# Patient Record
Sex: Male | Born: 1955 | Race: White | Hispanic: No | Marital: Married | Smoking: Never smoker
Health system: Southern US, Community
[De-identification: ages and names within clinical notes are randomized; demographics above are authoritative.]

## PROBLEM LIST (undated history)

## (undated) DIAGNOSIS — E7800 Pure hypercholesterolemia, unspecified: Secondary | ICD-10-CM

## (undated) DIAGNOSIS — Z87442 Personal history of urinary calculi: Secondary | ICD-10-CM

## (undated) DIAGNOSIS — G4730 Sleep apnea, unspecified: Secondary | ICD-10-CM

## (undated) DIAGNOSIS — M5430 Sciatica, unspecified side: Secondary | ICD-10-CM

## (undated) DIAGNOSIS — I10 Essential (primary) hypertension: Secondary | ICD-10-CM

## (undated) HISTORY — PX: TONSILLECTOMY: SUR1361

---

## 2005-02-14 ENCOUNTER — Ambulatory Visit (HOSPITAL_BASED_OUTPATIENT_CLINIC_OR_DEPARTMENT_OTHER): Admission: RE | Admit: 2005-02-14 | Discharge: 2005-02-14 | Payer: Self-pay | Admitting: Unknown Physician Specialty

## 2005-02-21 ENCOUNTER — Ambulatory Visit: Payer: Self-pay | Admitting: Internal Medicine

## 2006-08-05 ENCOUNTER — Encounter: Admission: RE | Admit: 2006-08-05 | Discharge: 2006-08-05 | Payer: Self-pay | Admitting: *Deleted

## 2006-09-21 ENCOUNTER — Encounter: Admission: RE | Admit: 2006-09-21 | Discharge: 2006-09-21 | Payer: Self-pay | Admitting: *Deleted

## 2006-10-14 ENCOUNTER — Encounter: Admission: RE | Admit: 2006-10-14 | Discharge: 2006-10-14 | Payer: Self-pay | Admitting: *Deleted

## 2009-07-01 ENCOUNTER — Encounter: Admission: RE | Admit: 2009-07-01 | Discharge: 2009-07-01 | Payer: Self-pay | Admitting: Neurosurgery

## 2009-07-15 ENCOUNTER — Encounter: Admission: RE | Admit: 2009-07-15 | Discharge: 2009-07-15 | Payer: Self-pay | Admitting: Neurosurgery

## 2010-07-26 ENCOUNTER — Encounter: Payer: Self-pay | Admitting: *Deleted

## 2010-11-20 NOTE — Procedures (Signed)
NAME:  Jeffrey Wilkerson, Jeffrey Wilkerson             ACCOUNT NO.:  [id number]   MEDICAL RECORD NO.:  [id number]          PATIENT TYPE:  OUT   LOCATION:  SLEEP CENTER                 FACILITY:  [facility]   PHYSICIAN:  Clinton D. Maple Hudson, M.D. DATE OF BIRTH:  1956-04-25   DATE OF STUDY:  02/14/2005                              NOCTURNAL POLYSOMNOGRAM   REFERRING PHYSICIAN:  Dr. Theadora Rama   DATE OF STUDY:  February 14, 2005   INDICATION FOR STUDY:  Hypersomnia with sleep apnea. Epworth Sleepiness  Score 13/24, BMI 29, weight 205 pounds.   SLEEP ARCHITECTURE:  Total sleep time 362 minutes with sleep efficiency 86%.  Stage I was 5%, stage II 65%, stages III and IV 28%, REM 1% of total sleep  time. Sleep latency 12 minutes, REM latency 254 minutes, awake after sleep  onset 47 minutes, arousal index increased at 40. No bedtime medication  taken.   RESPIRATORY DATA:  Split study protocol. Apnea/hypopnea index (RDI, AHI)  24.5 obstructive events per hour indicating moderate obstructive sleep  apnea/hypopnea syndrome. This included 17 central apneas, 22 obstructive  apneas, 1 mixed apnea and 108 hypopneas. Almost all events were while  supine. REM RDI 12. CPAP was titrated to 8 CWP, AHI 0, using a ResMed Swift  with small nasal pillows and heated humidifier.   OXYGEN DATA:  Moderate snoring with oxygen desaturation to a nadir of 86%.  After CPAP control saturation held 95-97% on room air.   CARDIAC DATA:  Sinus rhythm with sinus bradycardia.   MOVEMENT/PARASOMNIA:  A total of 152 limb jerks were recorded of which 29  were associated with arousal or awakening for a periodic limb movement with  arousal index of 4.8 per hour which is increased.   IMPRESSION/RECOMMENDATION:  1.  Moderate obstructive sleep apnea/hypopnea syndrome, apnea/hypopnea index      24.5 per hour with moderate snoring and oxygen desaturation to 86%.  2.  Successful continuous positive airway pressure titration to 8 CWP,      Respiratory  Disturbance Index 0 per hour,      using a ResMed Swift with small nasal pillows and heated humidifier.  3.  Periodic limb movement with arousal, 4.8 per hour.      Clinton D. Maple Hudson, M.D.  Diplomate, Biomedical engineer of Sleep Medicine  Electronically Signed     CDY/MEDQ  D:  02/21/2005 13:36:06  T:  02/21/2005 22:19:52  Job:  [id number]

## 2015-07-25 ENCOUNTER — Encounter (HOSPITAL_BASED_OUTPATIENT_CLINIC_OR_DEPARTMENT_OTHER): Payer: Self-pay

## 2015-07-25 ENCOUNTER — Emergency Department (HOSPITAL_BASED_OUTPATIENT_CLINIC_OR_DEPARTMENT_OTHER)
Admission: EM | Admit: 2015-07-25 | Discharge: 2015-07-25 | Discharge status: Against Medical Advice | Payer: BLUE CROSS/BLUE SHIELD | Attending: Emergency Medicine | Admitting: Emergency Medicine

## 2015-07-25 DIAGNOSIS — R197 Diarrhea, unspecified: Secondary | ICD-10-CM | POA: Diagnosis not present

## 2015-07-25 DIAGNOSIS — I10 Essential (primary) hypertension: Secondary | ICD-10-CM | POA: Insufficient documentation

## 2015-07-25 DIAGNOSIS — R1032 Left lower quadrant pain: Secondary | ICD-10-CM | POA: Insufficient documentation

## 2015-07-25 HISTORY — DX: Essential (primary) hypertension: I10

## 2015-07-25 HISTORY — DX: Pure hypercholesterolemia, unspecified: E78.00

## 2015-07-25 NOTE — ED Notes (Signed)
Pt explained risk of leaving against medical advice, pt aware and signed ama form on his on will

## 2015-07-25 NOTE — ED Notes (Signed)
LLQ pain-started this am-loose stool x 1-denies n/v-NAD-steady gait

## 2017-02-08 DIAGNOSIS — G8929 Other chronic pain: Secondary | ICD-10-CM | POA: Insufficient documentation

## 2017-09-15 ENCOUNTER — Other Ambulatory Visit: Payer: Self-pay | Admitting: Family Medicine

## 2017-09-15 DIAGNOSIS — Z789 Other specified health status: Secondary | ICD-10-CM

## 2017-09-15 DIAGNOSIS — R079 Chest pain, unspecified: Secondary | ICD-10-CM

## 2017-09-16 ENCOUNTER — Encounter (HOSPITAL_COMMUNITY): Payer: Self-pay

## 2017-09-16 ENCOUNTER — Ambulatory Visit (HOSPITAL_COMMUNITY)
Admission: RE | Admit: 2017-09-16 | Discharge: 2017-09-16 | Disposition: A | Discharge status: Home / Self Care | Payer: BLUE CROSS/BLUE SHIELD | Source: Ambulatory Visit | Attending: Family Medicine | Admitting: Family Medicine

## 2017-09-16 DIAGNOSIS — R079 Chest pain, unspecified: Secondary | ICD-10-CM | POA: Diagnosis present

## 2017-09-16 DIAGNOSIS — Z789 Other specified health status: Secondary | ICD-10-CM

## 2017-09-16 MED ORDER — IOPAMIDOL (ISOVUE-370) INJECTION 76%
INTRAVENOUS | Status: AC
  Filled 2017-09-16: qty 100

## 2017-09-16 MED ORDER — IOPAMIDOL (ISOVUE-370) INJECTION 76%
100.0000 mL | Freq: Once | INTRAVENOUS | Status: AC | PRN
  Administered 2017-09-16: 100 mL via INTRAVENOUS

## 2019-08-09 ENCOUNTER — Other Ambulatory Visit: Payer: Self-pay | Admitting: Urology

## 2019-08-14 ENCOUNTER — Encounter (HOSPITAL_BASED_OUTPATIENT_CLINIC_OR_DEPARTMENT_OTHER): Payer: Self-pay | Admitting: Urology

## 2019-08-14 ENCOUNTER — Other Ambulatory Visit: Payer: Self-pay

## 2019-08-14 NOTE — Progress Notes (Addendum)
Spoke w/ via phone for pre-op interview---[facility] needs dos----     I stat 8         Lab results------patient to have [facility] dr Shelly Flatten to fax ekg done 3-4 months ago to [phone], no ekg received from [facility], ekg order entered in epic COVID test ------08-18-2019 Arrive at -------630 am 2-17--2021 NPO after ------midnight Medications to take morning of surgery -----patient wishes to take atorvastatin after surgery Diabetic medication -----n/a Patient Special Instructions -----bring cpap mask and tubing Pre-Op special Istructions ----- Patient verbalized understanding of instructions that were given at this phone interview. Patient denies shortness of breath, chest pain, fever, cough a this phone interview.

## 2019-08-18 ENCOUNTER — Other Ambulatory Visit (HOSPITAL_COMMUNITY)
Admission: RE | Admit: 2019-08-18 | Discharge: 2019-08-18 | Disposition: A | Discharge status: Home / Self Care | Payer: BC Managed Care – PPO | Source: Ambulatory Visit | Attending: Urology | Admitting: Urology

## 2019-08-18 DIAGNOSIS — Z20822 Contact with and (suspected) exposure to covid-19: Secondary | ICD-10-CM | POA: Diagnosis not present

## 2019-08-18 DIAGNOSIS — Z01812 Encounter for preprocedural laboratory examination: Secondary | ICD-10-CM | POA: Diagnosis present

## 2019-08-18 LAB — SARS CORONAVIRUS 2 (TAT 6-24 HRS): SARS Coronavirus 2: NEGATIVE

## 2019-08-21 NOTE — Anesthesia Preprocedure Evaluation (Signed)
Anesthesia Evaluation  Patient identified by MRN, date of birth, ID band Patient awake    Reviewed: Allergy & Precautions, NPO status , Patient's Chart, lab work & pertinent test results  Airway Mallampati: II  TM Distance: >3 FB Neck ROM: Full    Dental no notable dental hx.    Pulmonary sleep apnea ,    Pulmonary exam normal breath sounds clear to auscultation       Cardiovascular hypertension, Pt. on medications Normal cardiovascular exam Rhythm:Regular Rate:Normal  HLD   Neuro/Psych negative neurological ROS  negative psych ROS   GI/Hepatic negative GI ROS, Neg liver ROS,   Endo/Other  negative endocrine ROS  Renal/GU Right ureteral stone  negative genitourinary   Musculoskeletal negative musculoskeletal ROS (+)   Abdominal   Peds negative pediatric ROS (+)  Hematology negative hematology ROS (+)   Anesthesia Other Findings   Reproductive/Obstetrics negative OB ROS                             Anesthesia Physical Anesthesia Plan  ASA: II  Anesthesia Plan: General   Post-op Pain Management:    Induction: Intravenous  PONV Risk Score and Plan: 3 and Ondansetron, Dexamethasone, Midazolam and Treatment may vary due to age or medical condition  Airway Management Planned: LMA  Additional Equipment: None  Intra-op Plan:   Post-operative Plan: Extubation in OR  Informed Consent: I have reviewed the patients History and Physical, chart, labs and discussed the procedure including the risks, benefits and alternatives for the proposed anesthesia with the patient or authorized representative who has indicated his/her understanding and acceptance.     Dental advisory given  Plan Discussed with: CRNA  Anesthesia Plan Comments:         Anesthesia Quick Evaluation

## 2019-08-22 ENCOUNTER — Encounter (HOSPITAL_BASED_OUTPATIENT_CLINIC_OR_DEPARTMENT_OTHER): Payer: Self-pay | Admitting: Urology

## 2019-08-22 ENCOUNTER — Encounter (HOSPITAL_BASED_OUTPATIENT_CLINIC_OR_DEPARTMENT_OTHER)
Admission: RE | Disposition: A | Discharge status: Home / Self Care | Payer: Self-pay | Source: Home / Self Care | Attending: Urology

## 2019-08-22 ENCOUNTER — Ambulatory Visit (HOSPITAL_BASED_OUTPATIENT_CLINIC_OR_DEPARTMENT_OTHER): Payer: BC Managed Care – PPO | Admitting: Anesthesiology

## 2019-08-22 ENCOUNTER — Ambulatory Visit (HOSPITAL_BASED_OUTPATIENT_CLINIC_OR_DEPARTMENT_OTHER)
Admission: RE | Admit: 2019-08-22 | Discharge: 2019-08-22 | Disposition: A | Discharge status: Home / Self Care | Payer: BC Managed Care – PPO | Attending: Urology | Admitting: Urology

## 2019-08-22 DIAGNOSIS — I10 Essential (primary) hypertension: Secondary | ICD-10-CM | POA: Insufficient documentation

## 2019-08-22 DIAGNOSIS — Z7982 Long term (current) use of aspirin: Secondary | ICD-10-CM | POA: Diagnosis not present

## 2019-08-22 DIAGNOSIS — N132 Hydronephrosis with renal and ureteral calculous obstruction: Secondary | ICD-10-CM | POA: Insufficient documentation

## 2019-08-22 DIAGNOSIS — G4730 Sleep apnea, unspecified: Secondary | ICD-10-CM | POA: Diagnosis not present

## 2019-08-22 DIAGNOSIS — Z79899 Other long term (current) drug therapy: Secondary | ICD-10-CM | POA: Diagnosis not present

## 2019-08-22 DIAGNOSIS — R109 Unspecified abdominal pain: Secondary | ICD-10-CM | POA: Diagnosis present

## 2019-08-22 HISTORY — DX: Sleep apnea, unspecified: G47.30

## 2019-08-22 HISTORY — DX: Personal history of urinary calculi: Z87.442

## 2019-08-22 HISTORY — PX: CYSTOSCOPY/URETEROSCOPY/HOLMIUM LASER/STENT PLACEMENT: SHX6546

## 2019-08-22 HISTORY — DX: Sciatica, unspecified side: M54.30

## 2019-08-22 LAB — POCT I-STAT, CHEM 8
BUN: 21 mg/dL (ref 8–23)
Calcium, Ion: 1.29 mmol/L (ref 1.15–1.40)
Chloride: 104 mmol/L (ref 98–111)
Creatinine, Ser: 1 mg/dL (ref 0.61–1.24)
Glucose, Bld: 99 mg/dL (ref 70–99)
HCT: 43 % (ref 39.0–52.0)
Hemoglobin: 14.6 g/dL (ref 13.0–17.0)
Potassium: 4.1 mmol/L (ref 3.5–5.1)
Sodium: 141 mmol/L (ref 135–145)
TCO2: 25 mmol/L (ref 22–32)

## 2019-08-22 SURGERY — CYSTOSCOPY/URETEROSCOPY/HOLMIUM LASER/STENT PLACEMENT
Anesthesia: General | Site: Ureter | Laterality: Right

## 2019-08-22 MED ORDER — ONDANSETRON HCL 4 MG/2ML IJ SOLN
INTRAMUSCULAR | Status: AC
  Filled 2019-08-22: qty 2

## 2019-08-22 MED ORDER — KETOROLAC TROMETHAMINE 30 MG/ML IJ SOLN
INTRAMUSCULAR | Status: DC | PRN
  Administered 2019-08-22: 30 mg via INTRAVENOUS

## 2019-08-22 MED ORDER — CEPHALEXIN 500 MG PO CAPS: 500.0000 mg | ORAL_CAPSULE | Freq: Two times a day (BID) | ORAL | 0 refills | Status: AC

## 2019-08-22 MED ORDER — FENTANYL CITRATE (PF) 100 MCG/2ML IJ SOLN
INTRAMUSCULAR | Status: AC
  Filled 2019-08-22: qty 2

## 2019-08-22 MED ORDER — PROPOFOL 10 MG/ML IV BOLUS
INTRAVENOUS | Status: AC
  Filled 2019-08-22: qty 40

## 2019-08-22 MED ORDER — PHENAZOPYRIDINE HCL 200 MG PO TABS: 200.0000 mg | ORAL_TABLET | Freq: Three times a day (TID) | ORAL | 0 refills | Status: AC | PRN

## 2019-08-22 MED ORDER — PROPOFOL 10 MG/ML IV BOLUS
INTRAVENOUS | Status: DC | PRN
  Administered 2019-08-22: 150 mg via INTRAVENOUS

## 2019-08-22 MED ORDER — HYDROMORPHONE HCL 1 MG/ML IJ SOLN
0.2500 mg | INTRAMUSCULAR | Status: DC | PRN
  Filled 2019-08-22: qty 0.5

## 2019-08-22 MED ORDER — SODIUM CHLORIDE 0.9 % IR SOLN
Status: DC | PRN
  Administered 2019-08-22: 3000 mL via INTRAVESICAL

## 2019-08-22 MED ORDER — DEXAMETHASONE SODIUM PHOSPHATE 10 MG/ML IJ SOLN
INTRAMUSCULAR | Status: AC
  Filled 2019-08-22: qty 1

## 2019-08-22 MED ORDER — ONDANSETRON HCL 4 MG/2ML IJ SOLN
INTRAMUSCULAR | Status: DC | PRN
  Administered 2019-08-22: 4 mg via INTRAVENOUS

## 2019-08-22 MED ORDER — ONDANSETRON HCL 4 MG PO TABS: 4.0000 mg | ORAL_TABLET | Freq: Every day | ORAL | 1 refills | Status: AC | PRN

## 2019-08-22 MED ORDER — HYDROCODONE-ACETAMINOPHEN 5-325 MG PO TABS: 1.0000 | ORAL_TABLET | ORAL | 0 refills | Status: AC | PRN

## 2019-08-22 MED ORDER — LIDOCAINE 2% (20 MG/ML) 5 ML SYRINGE
INTRAMUSCULAR | Status: AC
  Filled 2019-08-22: qty 5

## 2019-08-22 MED ORDER — LIDOCAINE 2% (20 MG/ML) 5 ML SYRINGE
INTRAMUSCULAR | Status: DC | PRN
  Administered 2019-08-22: 60 mg via INTRAVENOUS

## 2019-08-22 MED ORDER — DEXAMETHASONE SODIUM PHOSPHATE 10 MG/ML IJ SOLN
INTRAMUSCULAR | Status: DC | PRN
  Administered 2019-08-22: 10 mg via INTRAVENOUS

## 2019-08-22 MED ORDER — LACTATED RINGERS IV SOLN
INTRAVENOUS | Status: DC
  Filled 2019-08-22: qty 1000

## 2019-08-22 MED ORDER — MIDAZOLAM HCL 2 MG/2ML IJ SOLN
INTRAMUSCULAR | Status: AC
  Filled 2019-08-22: qty 2

## 2019-08-22 MED ORDER — ACETAMINOPHEN 500 MG PO TABS
ORAL_TABLET | ORAL | Status: AC
  Filled 2019-08-22: qty 2

## 2019-08-22 MED ORDER — CEFAZOLIN SODIUM-DEXTROSE 2-4 GM/100ML-% IV SOLN
INTRAVENOUS | Status: AC
  Filled 2019-08-22: qty 100

## 2019-08-22 MED ORDER — IOHEXOL 300 MG/ML  SOLN
INTRAMUSCULAR | Status: DC | PRN
  Administered 2019-08-22: 10 mL

## 2019-08-22 MED ORDER — EPHEDRINE 5 MG/ML INJ
INTRAVENOUS | Status: AC
  Filled 2019-08-22: qty 10

## 2019-08-22 MED ORDER — OXYCODONE HCL 5 MG PO TABS
5.0000 mg | ORAL_TABLET | Freq: Once | ORAL | Status: DC | PRN
  Filled 2019-08-22: qty 1

## 2019-08-22 MED ORDER — MIDAZOLAM HCL 5 MG/5ML IJ SOLN
INTRAMUSCULAR | Status: DC | PRN
  Administered 2019-08-22: 2 mg via INTRAVENOUS

## 2019-08-22 MED ORDER — MEPERIDINE HCL 25 MG/ML IJ SOLN
6.2500 mg | INTRAMUSCULAR | Status: DC | PRN
  Filled 2019-08-22: qty 1

## 2019-08-22 MED ORDER — CEFAZOLIN SODIUM-DEXTROSE 2-3 GM-%(50ML) IV SOLR
INTRAVENOUS | Status: DC | PRN
  Administered 2019-08-22: 2 g via INTRAVENOUS

## 2019-08-22 MED ORDER — PROMETHAZINE HCL 25 MG/ML IJ SOLN
6.2500 mg | INTRAMUSCULAR | Status: DC | PRN
  Filled 2019-08-22: qty 1

## 2019-08-22 MED ORDER — FENTANYL CITRATE (PF) 100 MCG/2ML IJ SOLN
INTRAMUSCULAR | Status: DC | PRN
  Administered 2019-08-22 (×2): 50 ug via INTRAVENOUS

## 2019-08-22 MED ORDER — ACETAMINOPHEN 500 MG PO TABS
1000.0000 mg | ORAL_TABLET | Freq: Once | ORAL | Status: AC
  Administered 2019-08-22: 1000 mg via ORAL
  Filled 2019-08-22: qty 2

## 2019-08-22 MED ORDER — OXYCODONE HCL 5 MG/5ML PO SOLN
5.0000 mg | Freq: Once | ORAL | Status: DC | PRN
  Filled 2019-08-22: qty 5

## 2019-08-22 MED ORDER — OXYBUTYNIN CHLORIDE 5 MG PO TABS: 5.0000 mg | ORAL_TABLET | Freq: Three times a day (TID) | ORAL | 1 refills | Status: AC | PRN

## 2019-08-22 MED ORDER — EPHEDRINE SULFATE 50 MG/ML IJ SOLN
INTRAMUSCULAR | Status: DC | PRN
  Administered 2019-08-22: 10 mg via INTRAVENOUS

## 2019-08-22 MED ORDER — KETOROLAC TROMETHAMINE 30 MG/ML IJ SOLN
INTRAMUSCULAR | Status: AC
  Filled 2019-08-22: qty 1

## 2019-08-22 SURGICAL SUPPLY — 28 items
APL SKNCLS STERI-STRIP NONHPOA (GAUZE/BANDAGES/DRESSINGS)
BAG DRAIN URO-CYSTO SKYTR STRL (DRAIN) ×3 IMPLANT
BAG DRN UROCATH (DRAIN) ×1
BASKET STONE 1.7 NGAGE (UROLOGICAL SUPPLIES) IMPLANT
BASKET ZERO TIP NITINOL 2.4FR (BASKET) ×3 IMPLANT
BENZOIN TINCTURE PRP APPL 2/3 (GAUZE/BANDAGES/DRESSINGS) IMPLANT
BSKT STON RTRVL ZERO TP 2.4FR (BASKET) ×1
CATH URET 5FR 28IN OPEN ENDED (CATHETERS) ×2 IMPLANT
CLOSURE WOUND 1/2 X4 (GAUZE/BANDAGES/DRESSINGS)
CLOTH BEACON ORANGE TIMEOUT ST (SAFETY) ×3 IMPLANT
FIBER LASER FLEXIVA 365 (UROLOGICAL SUPPLIES) IMPLANT
FIBER LASER TRAC TIP (UROLOGICAL SUPPLIES) ×2 IMPLANT
GLOVE BIO SURGEON STRL SZ7.5 (GLOVE) ×3 IMPLANT
GOWN STRL REUS W/TWL XL LVL3 (GOWN DISPOSABLE) ×5 IMPLANT
GUIDEWIRE STR DUAL SENSOR (WIRE) ×2 IMPLANT
GUIDEWIRE ZIPWRE .038 STRAIGHT (WIRE) ×3 IMPLANT
IV NS IRRIG 3000ML ARTHROMATIC (IV SOLUTION) ×3 IMPLANT
KIT TURNOVER CYSTO (KITS) ×3 IMPLANT
MANIFOLD NEPTUNE II (INSTRUMENTS) ×3 IMPLANT
NS IRRIG 500ML POUR BTL (IV SOLUTION) ×3 IMPLANT
PACK CYSTO (CUSTOM PROCEDURE TRAY) ×3 IMPLANT
SHEATH URETERAL 12FRX35CM (MISCELLANEOUS) ×2 IMPLANT
STENT URET 6FRX24 CONTOUR (STENTS) ×2 IMPLANT
STRIP CLOSURE SKIN 1/2X4 (GAUZE/BANDAGES/DRESSINGS) IMPLANT
SYR 10ML LL (SYRINGE) ×3 IMPLANT
TUBE CONNECTING 12'X1/4 (SUCTIONS) ×1
TUBE CONNECTING 12X1/4 (SUCTIONS) ×1 IMPLANT
TUBING UROLOGY SET (TUBING) ×3 IMPLANT

## 2019-08-22 NOTE — H&P (Signed)
PRE-OP H&P  Office Visit Report     08/09/2019   --------------------------------------------------------------------------------   Jeffrey Wilkerson  MRN: [id number]  DOB: 04-03-1956, 64 year old Male   PRIMARY CARE:  Loraine Leriche MacPherson  REFERRING:  Ozella Rocks, MD  PROVIDER:  Rhoderick Moody, M.D.  LOCATION:  [facility] [phone]     --------------------------------------------------------------------------------   CC/HPI: Right flank pain   Ms. Cauthon is a 64 year old male recently seen by Dr. Sherron Monday for microscopic hematuria. He was found to have multiple right proximal ureteral calculi with moderate hydronephrosis.   08/09/19: The patient is here today for a routine follow-up and KUB. He denies flank pain, dysuria, hematuria, nausea or fever/chills. UA today shows no signs of acute cystitis.     ALLERGIES: Td    MEDICATIONS: Percocet 5 mg-325 mg tablet 1-2 tablet PO Q 6 H PRN  Aspirin 81 MG TABS Oral  Fish Oil CAPS Oral  Flomax 0.4 mg capsule 1 capsule PO Daily  Lipitor 10 mg tablet Oral  Tab-A-Vite With Iron tablet Oral     GU PSH: Locm 300-399Mg /Ml Iodine,1Ml - 08/03/2019, 08/03/2019       PSH Notes: Tonsillectomy   NON-GU PSH: Remove Tonsils - 2007/11/19     GU PMH: Ureteral calculus - 08/03/2019 Microscopic hematuria - 07/18/2019 Urinary Frequency - 07/18/2019 Abdominal Pain Unspec, Abdominal pain - 2012/11/18 Incontinence w/o Sensation, Urinary incontinence without sensory awareness - 11/18/12      PMH Notes:  1898-07-05 00:00:00 - Note: Normal Routine History And Physical Adult   NON-GU PMH: Personal history of other diseases of the circulatory system, History of hypertension - 11-18-12 Personal history of other diseases of the nervous system and sense organs, History of sleep apnea - 2014 Personal history of other endocrine, nutritional and metabolic disease, History of hypercholesterolemia - 11/18/12    FAMILY HISTORY: Death In The Family Father -  Father Death In The Family Mother - Mother Family Health Status Number - Runs In Family   SOCIAL HISTORY: No Social History     Notes: Caffeine Use, Marital History - Currently Married, Occupation:, Alcohol Use, Tobacco Use   REVIEW OF SYSTEMS:    GU Review Male:   Patient denies frequent urination, hard to postpone urination, burning/ pain with urination, get up at night to urinate, leakage of urine, stream starts and stops, trouble starting your stream, have to strain to urinate , erection problems, and penile pain.  Gastrointestinal (Upper):   Patient denies nausea, vomiting, and indigestion/ heartburn.  Gastrointestinal (Lower):   Patient denies diarrhea and constipation.  Constitutional:   Patient denies fever, night sweats, weight loss, and fatigue.  Skin:   Patient denies skin rash/ lesion and itching.  Eyes:   Patient denies blurred vision and double vision.  Ears/ Nose/ Throat:   Patient denies sore throat and sinus problems.  Hematologic/Lymphatic:   Patient denies swollen glands and easy bruising.  Cardiovascular:   Patient denies leg swelling and chest pains.  Respiratory:   Patient denies cough and shortness of breath.  Endocrine:   Patient denies excessive thirst.  Musculoskeletal:   Patient reports back pain. Patient denies joint pain.  Neurological:   Patient denies dizziness and headaches.  Psychologic:   Patient denies depression and anxiety.   Notes: R side flank pain     VITAL SIGNS:      08/09/2019 08:47 AM  Weight 205 lb / 92.99 kg  Height 70 in / 177.8 cm  BP 126/73 mmHg  Heart Rate 57 /min  Temperature 97.5 F / 36.3 C  BMI 29.4 kg/m   MULTI-SYSTEM PHYSICAL EXAMINATION:    Constitutional: Well-nourished. No physical deformities. Normally developed. Good grooming.  Neurologic / Psychiatric: Oriented to time, oriented to place, oriented to person. No depression, no anxiety, no agitation.  Musculoskeletal: Normal gait and station of head and neck.      PAST DATA REVIEWED:  Source Of History:  Patient  Records Review:   Previous Patient Records  X-Ray Review: C.T. Abdomen/Pelvis: Reviewed Films. Reviewed Report. Discussed With Patient.    Notes:                     CLINICAL DATA: Microhematuria   EXAM:  CT ABDOMEN AND PELVIS WITHOUT AND WITH CONTRAST   TECHNIQUE:  Multidetector CT imaging of the abdomen and pelvis was performed  following the standard protocol before and following the bolus  administration of intravenous contrast.   CONTRAST: 125 cc Omnipaque   COMPARISON: None.   FINDINGS:  Lower chest: Lung bases are clear.   Hepatobiliary: LEFT hepatic lobe lesion measuring 2.7 cm has  internal enhancement. Additional smaller hepatic lesions are noted.  Gallstone within lumen the gallbladder. There is some gallbladder  wall thickening. No gallbladder distension. Common bile duct normal   Pancreas: Pancreas is normal. No ductal dilatation. No pancreatic  inflammation.   Spleen: Normal spleen   Adrenals/urinary tract: Adrenal glands normal.   There is hydronephrosis and proximal hydroureter on the RIGHT. There  is several calculi in the mid RIGHT ureter obstructing the lumen.  More distal larger calculus measures 8 mm (image 46/2). More  proximal calculus measures 5 mm. These calculi are at the L3  vertebral body level. Distal to the RIGHT ureteral calculus, the  RIGHT ureter is collapsed. However, there is a more distal calculus  in the course of the RIGHT ureter measuring 5 mm (image 75/2); this  calculus is approximately 1 cm from the RIGHT vascular junction.   No bladder calculi.   Contrast enhanced imaging demonstrates no enhancing renal cortical  lesion.   No bladder calculi, enhancing bladder lesions, or filling defect  within the bladder.   Stomach/Bowel: Stomach, small bowel, appendix, and cecum are normal.  The colon and rectosigmoid colon are normal.   Vascular/Lymphatic: Abdominal aorta is normal  caliber. No periportal  or retroperitoneal adenopathy. No pelvic adenopathy.   Reproductive: Normal prostate gland   Other: None   Musculoskeletal: No aggressive osseous lesion.   IMPRESSION:  1. Obstructing calculi within the mid RIGHT ureter with moderate  hydronephrosis and hydroureter.  2. Nine obstructing distal RIGHT ureteral calculus approximately 1  cm from the RIGHT vesicoureteral junction.  3. No enhancing renal cortical lesion.  4. Enhancing lesion in the LEFT hepatic lobe is indeterminate.  Recommend MRI with without contrast for further characterization.    Electronically Signed  By: Genevive Bi M.D.  On: 08/03/2019 15:44     PROCEDURES:         KUB - [id number]  A single view of the abdomen is obtained.      Patient confirmed No Neulasta OnPro Device.  There are 2 calcifications in the proximal aspects of the right ureter measuring approximately 9 mm and 5 mm, respectively. There are no calcifications seen along the expected course of the left ureter or within the confines of the left renal shadow. No bladder stones noted. No bony or bowel abnormalities are appreciated.  Urinalysis w/Scope Dipstick Dipstick Cont'd Micro  Color: Yellow Bilirubin: Neg mg/dL WBC/hpf: 6 - 10/hpf  Appearance: Clear Ketones: Neg mg/dL RBC/hpf: 3 - 10/hpf  Specific Gravity: 1.025 Blood: Trace ery/uL Bacteria: NS (Not Seen)  pH: <=5.0 Protein: Neg mg/dL Cystals: NS (Not Seen)  Glucose: Neg mg/dL Urobilinogen: 0.2 mg/dL Casts: NS (Not Seen)    Nitrites: Neg Trichomonas: Not Present    Leukocyte Esterase: Trace leu/uL Mucous: Not Present      Epithelial Cells: NS (Not Seen)      Yeast: NS (Not Seen)      Sperm: Not Present    ASSESSMENT:      ICD-10 Details  1 GU:   Ureteral calculus - N20.1 Right, Acute, Systemic Symptoms  2   Ureteral obstruction secondary to calculous - N13.2 Right, Acute, Systemic Symptoms   PLAN:            Medications Stop Meds: Percocet 5  mg-325 mg tablet 1-2 tablet PO Q 6 H PRN  Start: 08/03/2019  Discontinue: 08/09/2019  - Reason: The medication cycle was completed.            Orders Labs Urine Culture          Schedule Return Visit/Planned Activity: ASAP - Schedule Surgery          Document Letter(s):  Created for Patient: Clinical Summary         Notes:   -KUB today shows his right proximal ureteral stone in stable position.  The risks, benefits and alternatives of cystoscopy with RIGHT ureteroscopy, laser lithotripsy and ureteral stent placement was discussed the patient. Risks included, but are not limited to: bleeding, urinary tract infection, ureteral injury/avulsion, ureteral stricture formation, retained stone fragments, the possibility that multiple surgeries may be required to treat the stone(s), MI, stroke, PE and the inherent risks of general anesthesia. The patient voices understanding and wishes to proceed.

## 2019-08-22 NOTE — Discharge Instructions (Signed)
      Post Anesthesia Home Care Instructions  Activity: Get plenty of rest for the remainder of the day. A responsible individual must stay with you for 24 hours following the procedure.  For the next 24 hours, DO NOT: -Drive a car -Advertising copywriter -Drink alcoholic beverages -Take any medication unless instructed by your physician -Make any legal decisions or sign important papers.  Meals: Start with liquid foods such as gelatin or soup. Progress to regular foods as tolerated. Avoid greasy, spicy, heavy foods. If nausea and/or vomiting occur, drink only clear liquids until the nausea and/or vomiting subsides. Call your physician if vomiting continues.  Special Instructions/Symptoms: Your throat may feel dry or sore from the anesthesia or the breathing tube placed in your throat during surgery. If this causes discomfort, gargle with warm salt water. The discomfort should disappear within 24 hours.  Do not take any Tylenol until after 1:00 pm today Do not take any nonsteroidal anti inflammatories until after 3:30 pm today.

## 2019-08-22 NOTE — Op Note (Signed)
Operative Note  Preoperative diagnosis:  1.  5 mm distal and 8 mm proximal right ureteral calculi  Postoperative diagnosis: 1.  5 mm distal and 8 mm proximal right ureteral calculi  Procedure(s): 1.  Cystoscopy with right ureteroscopy, holmium laser lithotripsy and right JJ stent placement 2.  Right retrograde pyelogram with intraoperative interpretation of fluoroscopic imaging  Surgeon: Rhoderick Moody, MD  Assistants:  None  Anesthesia:  General  Complications:  None  EBL: 5 mL  Specimens: 1.  Right ureteral stone fragments  Drains/Catheters: 1.  Right 6 French, 24 cm JJ stent without tether  Intraoperative findings:   1. 5 mm nonobstructing right distal ureteral calculus 2. Severely impacted and obstructing 8 mm proximal right ureteral calculus 3. Right retrograde pyelogram revealed uniform dilation of the entire right ureter along with dilation of the right renal pelvis and its associated calyces.  There was an expected filling defect in the proximal aspects of the right ureter, consistent with his obstructing stone seen on recent cross-sectional imaging.  No other significant filling defects were noted within the right renal pelvis or along the right ureter.  Indication:  Jeffrey Wilkerson is a 64 y.o. male with multiple right ureteral calculi.  He has been consented for the above procedures, voices understanding and wishes to proceed.  Description of procedure:  After informed consent was obtained, the patient was brought to the operating room and general LMA anesthesia was administered. The patient was then placed in the dorsolithotomy position and prepped and draped in the usual sterile fashion. A timeout was performed. A 23 French rigid cystoscope was then inserted into the urethral meatus and advanced into the bladder under direct vision. A complete bladder survey revealed no intravesical pathology.  A 5 French ureteral catheter was then inserted into the right  ureteral orifice and a retrograde pyelogram was obtained, with the findings listed above.  A Glidewire was then used to intubate the lumen of the ureteral catheter and was advanced up to the right renal pelvis, under fluoroscopic guidance.  I had to extensively manipulate the wire beyond the level of his his obstructing proximal ureteral stone.  The catheter was then removed, leaving the wire in place.  A semirigid ureteroscope was then advanced into the distal aspects of the right ureter where his 5 mm stone was identified.  A 200 m holmium laser was then used to fracture the stone into several smaller pieces.  The stone fragments were then extracted using a 0 tip basket.  The semirigid ureteroscope was then advanced up to the level of his severely obstructing and impacted right proximal ureteral calculus.  A sensor wire was then carefully advanced beyond the level of his obstructing stone and into the right renal pelvis, or fluoroscopic guidance.  The semirigid ureteroscope was then removed, leaving both wires in place.  A 12 French, 35 cm ureteral access sheath was then advanced over the sensor wire and into good position within the proximal aspects of the right ureter.    A flexible ureteroscope was then advanced through the lumen of the access sheath and up to the level of his obstructing proximal ureteral stone.  A 200 m holmium laser was then used to fragment his severely obstructing and impacted proximal right ureteral calculus.  Some the stone fragments migrated into a midpole calyx within the right kidney and were further dusted with holmium laser.  Reinspection of the proximal right ureter where his stone was impacted revealed a 1 cm circumferential  area of mucosal shearing with no additional areas of insult.  The ureteral access sheath was then removed under direct vision with no residual stone burden or additional ureteral trauma identified.  A 6 French, 24 cm JJ stent was then placed over  the working wire and into good position within the right collecting system, confirming placement via fluoroscopy.  The patient's bladder was drained.  He tolerated the procedure well and was transferred to the postanesthesia in stable condition.  Plan: His right ureteral stent will need to stay in place for approximately 1 month to allow for ureteral healing.  Will arrange outpatient follow-up in 1 month for office cystoscopy and stent removal.

## 2019-08-22 NOTE — Anesthesia Procedure Notes (Signed)
Procedure Name: LMA Insertion Date/Time: 08/22/2019 8:37 AM Performed by: Briant Sites, CRNA Pre-anesthesia Checklist: Patient identified, Emergency Drugs available, Suction available and Patient being monitored Patient Re-evaluated:Patient Re-evaluated prior to induction Oxygen Delivery Method: Circle system utilized Preoxygenation: Pre-oxygenation with 100% oxygen Induction Type: IV induction Ventilation: Mask ventilation without difficulty LMA: LMA inserted LMA Size: 5.0 Number of attempts: 1 Airway Equipment and Method: Bite block Placement Confirmation: positive ETCO2 Tube secured with: Tape Dental Injury: Teeth and Oropharynx as per pre-operative assessment

## 2019-08-22 NOTE — Anesthesia Postprocedure Evaluation (Signed)
Anesthesia Post Note  Patient: Jeffrey Wilkerson  Procedure(s) Performed: CYSTOSCOPY/URETEROSCOPY/HOLMIUM LASER/STENT PLACEMENT (Right Ureter)     Patient location during evaluation: PACU Anesthesia Type: General Level of consciousness: awake and alert, oriented and patient cooperative Pain management: pain level controlled Vital Signs Assessment: post-procedure vital signs reviewed and stable Respiratory status: spontaneous breathing, nonlabored ventilation and respiratory function stable Cardiovascular status: blood pressure returned to baseline and stable Postop Assessment: no apparent nausea or vomiting Anesthetic complications: no    Last Vitals:  Vitals:   08/22/19 0655 08/22/19 0954  BP: 131/83 (!) (P) 141/84  Pulse: (!) 56 (!) (P) 55  Resp: 16 (P) 16  Temp: 36.7 C (P) 36.5 C  SpO2: 100% (P) 100%    Last Pain:  Vitals:   08/22/19 0655  TempSrc: Oral  PainSc: 0-No pain                 Lannie Fields

## 2019-08-22 NOTE — Transfer of Care (Signed)
Immediate Anesthesia Transfer of Care Note  Patient: Jeffrey Wilkerson  Procedure(s) Performed: CYSTOSCOPY/URETEROSCOPY/HOLMIUM LASER/STENT PLACEMENT (Right Ureter)  Patient Location: PACU  Anesthesia Type:General  Level of Consciousness: drowsy  Airway & Oxygen Therapy: Patient Spontanous Breathing and Patient connected to nasal cannula oxygen  Post-op Assessment: Report given to RN and Post -op Vital signs reviewed and stable  Post vital signs: Reviewed and stable  Last Vitals: 141/81, 55, 17, 100% Vitals Value Taken Time  BP    Temp    Pulse    Resp    SpO2      Last Pain:  Vitals:   08/22/19 0655  TempSrc: Oral  PainSc: 0-No pain      Patients Stated Pain Goal: 5 (08/22/19 0655)  Complications: No apparent anesthesia complications

## 2020-06-25 ENCOUNTER — Other Ambulatory Visit: Payer: Self-pay

## 2020-06-25 ENCOUNTER — Ambulatory Visit (INDEPENDENT_AMBULATORY_CARE_PROVIDER_SITE_OTHER): Payer: BC Managed Care – PPO

## 2020-06-25 ENCOUNTER — Ambulatory Visit: Payer: BC Managed Care – PPO | Admitting: Orthopedic Surgery

## 2020-06-25 ENCOUNTER — Encounter: Payer: Self-pay | Admitting: Orthopedic Surgery

## 2020-06-25 VITALS — Ht 70.0 in | Wt 200.0 lb

## 2020-06-25 DIAGNOSIS — M542 Cervicalgia: Secondary | ICD-10-CM

## 2020-06-25 DIAGNOSIS — R200 Anesthesia of skin: Secondary | ICD-10-CM

## 2020-06-25 DIAGNOSIS — M5412 Radiculopathy, cervical region: Secondary | ICD-10-CM | POA: Diagnosis not present

## 2020-06-29 ENCOUNTER — Encounter: Payer: Self-pay | Admitting: Orthopedic Surgery

## 2020-06-29 NOTE — Progress Notes (Signed)
Office Visit Note   Patient: Jeffrey Wilkerson           Date of Birth: Oct 31, 1955           MRN: [id number] Visit Date: 06/25/2020 Requested by: Ozella Rocks, MD [address] [place],  [place] [address] PCP: Ozella Rocks, MD  Subjective: Chief Complaint  Patient presents with  . Neck - Pain    HPI: Jeffrey Wilkerson is a 64 y.o. male who presents to the office complaining of neck pain.  Patient states that he has been dealing with neck pain as well as numbness and tingling in the bilateral fourth/fifth fingers over the last year.  He denies any injury.  He complains of primarily left-sided neck pain with tightness that extends into the left shoulder blade.  This pain waxes and wanes in intensity.  He notes neck stiffness though he does try to stretch his neck daily to improve the stiffness.  He has been taking ibuprofen with some relief as needed.  No history of neck surgery or carpal tunnel syndrome, ulnar neuropathy.  He does have a history of lumbar spine ESI's with good relief.  He has never had his neck worked up before.  He notes the numbness and tingling in his fourth and fifth fingers is worse with his arm stents.  He has worn braces on his wrists that have provided some relief.  He wakes with his fourth and fifth fingers numb bilaterally.  No numbness and tingling throughout the first, second, third fingers.  No radicular pain from his neck down into his fingertips.  No significant subjective weakness.                ROS: All systems reviewed are negative as they relate to the chief complaint within the history of present illness.  Patient denies fevers or chills.  Assessment & Plan: Visit Diagnoses:  1. Neck pain   2. Radiculopathy, cervical region   3. Bilateral hand numbness     Plan: Patient is a 64 year old male who presents complaining of neck pain and numbness and tingling bilateral fourth and fifth fingers.  He has had symptoms for about a year.  Symptoms  are worse at night.  He has tried home exercise program with no lasting relief of his neck pain.  Radiographs of the cervical spine reveal moderate to severe degenerative changes with loss of disc space and anterior osteophytes noted throughout.  Additionally, patient has bilateral fourth and fifth finger numbness/tingling that seems to be coming from ulnar neuropathy with subluxing ulnar nerve and positive elbow flexion test.  He wakes with numbness and tingling in his fingers at night.  Plan to order bilateral upper extremity nerve conduction study for evaluation of ulnar neuropathy as well as cervical spine MRI to evaluate for bilateral radiculopathy.  Follow-up after MRI and nerve study to review results.  Follow-Up Instructions: No follow-ups on file.   Orders:  Orders Placed This Encounter  Procedures  . XR Cervical Spine 2 or 3 views  . MR Cervical Spine w/o contrast  . Ambulatory referral to Physical Medicine Rehab   No orders of the defined types were placed in this encounter.     Procedures: No procedures performed   Clinical Data: No additional findings.  Objective: Vital Signs: Ht 5\' 10"  (1.778 m)   Wt 200 lb (90.7 kg)   BMI 28.70 kg/m   Physical Exam:  Constitutional: Patient appears well-developed HEENT:  Head: Normocephalic Eyes:EOM are normal Neck: Normal range of motion Cardiovascular: Normal rate Pulmonary/chest: Effort normal Neurologic: Patient is alert Skin: Skin is warm Psychiatric: Patient has normal mood and affect  Ortho Exam: Ortho exam demonstrates tenderness throughout the axial cervical spine and left-sided paraspinal musculature.  Negative Spurling sign bilaterally.  5/5 motor strength of the bilateral grip strength, pronation/supination, bicep, tricep, deltoid.  Sensation intact in all dermatomes of the bilateral lower extremities aside from the volar aspect of the fourth and fifth fingers.  No significant muscle wasting noted.  Mild weakness of  the small finger abduction but no significant weakness of thumb abduction.  Negative Froment's sign.  Negative Tinel's sign, Phalen's, Durkan sign at the bilateral wrists.  Mildly subluxing ulnar nerve noted bilaterally.  Numbness and tingling worse with elbow flexion test bilaterally.  Specialty Comments:  No specialty comments available.  Imaging: No results found.   PMFS History: There are no problems to display for this patient.  Past Medical History:  Diagnosis Date  . High cholesterol   . History of kidney stones   . Hypertension   . Sciatica   . Sleep apnea     No family history on file.  Past Surgical History:  Procedure Laterality Date  . CYSTOSCOPY/URETEROSCOPY/HOLMIUM LASER/STENT PLACEMENT Right 08/22/2019   Procedure: CYSTOSCOPY/URETEROSCOPY/HOLMIUM LASER/STENT PLACEMENT;  Surgeon: Rene Paci, MD;  Location: [facility];  Service: Urology;  Laterality: Right;  . TONSILLECTOMY  age 67   Social History   Occupational History  . Not on file  Tobacco Use  . Smoking status: Never Smoker  . Smokeless tobacco: Never Used  Vaping Use  . Vaping Use: Never used  Substance and Sexual Activity  . Alcohol use: Yes    Comment: occ  . Drug use: No  . Sexual activity: Not on file

## 2020-08-01 ENCOUNTER — Other Ambulatory Visit: Payer: Self-pay

## 2020-08-01 ENCOUNTER — Ambulatory Visit: Payer: BC Managed Care – PPO | Admitting: Physical Medicine and Rehabilitation

## 2020-08-01 ENCOUNTER — Encounter: Payer: Self-pay | Admitting: Physical Medicine and Rehabilitation

## 2020-08-01 DIAGNOSIS — R202 Paresthesia of skin: Secondary | ICD-10-CM

## 2020-08-01 NOTE — Progress Notes (Signed)
Pt state pain both ring and pinky fingers that travels up to both elbow and shoulders. Pt state when he lift thing he can feel pain. Pt state he rake pain meds and icy his hands to ease the pain. Pt state he sleep with his arms out. Pt state he is right handed.  Numeric Pain Rating Scale and Functional Assessment Average Pain 3   In the last MONTH (on 0-10 scale) has pain interfered with the following?  1. General activity like being  able to carry out your everyday physical activities such as walking, climbing stairs, carrying groceries, or moving a chair?  Rating(7)

## 2020-08-04 ENCOUNTER — Ambulatory Visit
Admission: RE | Admit: 2020-08-04 | Discharge: 2020-08-04 | Disposition: A | Discharge status: Home / Self Care | Payer: 59 | Source: Ambulatory Visit | Attending: Orthopedic Surgery | Admitting: Orthopedic Surgery

## 2020-08-04 ENCOUNTER — Other Ambulatory Visit: Payer: Self-pay

## 2020-08-04 DIAGNOSIS — M5412 Radiculopathy, cervical region: Secondary | ICD-10-CM

## 2020-08-05 NOTE — Procedures (Signed)
EMG & NCV Findings: Evaluation of the right median motor nerve showed decreased conduction velocity (Elbow-Wrist, 49 m/s).  The left ulnar motor nerve showed borderline decreased conduction velocity (B Elbow-Wrist, 50 m/s).  The left median (across palm) sensory nerve showed prolonged distal peak latency (4.1 ms).  The right median (across palm) sensory nerve showed prolonged distal peak latency (Wrist, 3.7 ms).  The left radial sensory nerve showed no response (Wrist).  The right ulnar sensory nerve showed borderline prolonged distal peak latency (3.8 ms), reduced amplitude (14.6 V), and decreased conduction velocity (Wrist-5th Digit, 37 m/s).  All remaining nerves (as indicated in the following tables) were within normal limits.  Left vs. Right side comparison data for the ulnar motor nerve indicates abnormal L-R latency difference (1.0 ms).  The ulnar sensory nerve indicates abnormal L-R latency difference (0.5 ms).  All remaining left vs. right side differences were within normal limits.    All examined muscles (as indicated in the following table) showed no evidence of electrical instability.    Impression: Essentially NORMAL electrodiagnostic study of both upper limbs.  There is no significant electrodiagnostic evidence of nerve entrapment, brachial plexopathy or cervical radiculopathy.    As you know, purely sensory or demyelinating radiculopathies and chemical radiculitis may not be detected with this particular electrodiagnostic study.  Recommendations: 1.  Follow-up with referring physician. 2.  Continue current management of symptoms.  ___________________________ Naaman Plummer FAAPMR Board Certified, American Board of Physical Medicine and Rehabilitation    Nerve Conduction Studies Anti Sensory Summary Table   Stim Site NR Peak (ms) Norm Peak (ms) P-T Amp (V) Norm P-T Amp Site1 Site2 Delta-P (ms) Dist (cm) Vel (m/s) Norm Vel (m/s)  Left Median Acr Palm Anti Sensory (2nd Digit)   32.2C  Wrist    *4.1 <3.6 16.3 >10        Right Median Acr Palm Anti Sensory (2nd Digit)  31.6C  Wrist    *3.7 <3.6 20.1 >10 Wrist Palm 1.7 0.0    Palm    2.0 <2.0 25.1         Left Radial Anti Sensory (Base 1st Digit)  31.7C  Wrist *NR  <3.1   Wrist Base 1st Digit  0.0    Right Radial Anti Sensory (Base 1st Digit)  31.6C  Wrist    2.7 <3.1 18.1  Wrist Base 1st Digit 2.7 0.0    Left Ulnar Anti Sensory (5th Digit)  32.6C  Wrist    3.3 <3.7 17.4 >15.0 Wrist 5th Digit 3.3 14.0 42 >38  Right Ulnar Anti Sensory (5th Digit)  31.6C  Wrist    *3.8 <3.7 *14.6 >15.0 Wrist 5th Digit 3.8 14.0 *37 >38   Motor Summary Table   Stim Site NR Onset (ms) Norm Onset (ms) O-P Amp (mV) Norm O-P Amp Site1 Site2 Delta-0 (ms) Dist (cm) Vel (m/s) Norm Vel (m/s)  Left Median Motor (Abd Poll Brev)  32C  Wrist    3.7 <4.2 8.4 >5 Elbow Wrist 4.1 21.5 52 >50  Elbow    7.8  5.0         Right Median Motor (Abd Poll Brev)  31.5C  Wrist    3.4 <4.2 7.2 >5 Elbow Wrist 4.6 22.5 *49 >50  Elbow    8.0  7.1         Left Ulnar Motor (Abd Dig Min)  31.3C  Wrist    2.8 <4.2 7.5 >3 B Elbow Wrist 4.2 21.0 *50 >53  B Elbow  7.0  6.1  A Elbow B Elbow 1.9 11.0 58 >53  A Elbow    8.9  6.3         Right Ulnar Motor (Abd Dig Min)  31.5C  Wrist    3.8 <4.2 7.6 >3 B Elbow Wrist 4.0 21.0 53 >53  B Elbow    7.8  5.1  A Elbow B Elbow 1.7 10.0 59 >53  A Elbow    9.5  6.6          EMG   Side Muscle Nerve Root Ins Act Fibs Psw Amp Dur Poly Recrt Int Dennie Bible Comment  Right Abd Poll Brev Median C8-T1 Nml Nml Nml Nml Nml 0 Nml Nml   Right 1stDorInt Ulnar C8-T1 Nml Nml Nml Nml Nml 0 Nml Nml   Right PronatorTeres Median C6-7 Nml Nml Nml Nml Nml 0 Nml Nml   Right Biceps Musculocut C5-6 Nml Nml Nml Nml Nml 0 Nml Nml   Right Deltoid Axillary C5-6 Nml Nml Nml Nml Nml 0 Nml Nml                                                                           Nerve Conduction Studies Anti Sensory Left/Right Comparison   Stim Site L Lat  (ms) R Lat (ms) L-R Lat (ms) L Amp (V) R Amp (V) L-R Amp (%) Site1 Site2 L Vel (m/s) R Vel (m/s) L-R Vel (m/s)  Median Acr Palm Anti Sensory (2nd Digit)  32.2C  Wrist *4.1 *3.7 0.4 16.3 20.1 18.9       Radial Anti Sensory (Base 1st Digit)  31.7C  Wrist  2.7   18.1  Wrist Base 1st Digit     Ulnar Anti Sensory (5th Digit)  32.6C  Wrist 3.3 *3.8 *0.5 17.4 *14.6 16.1 Wrist 5th Digit 42 *37 5   Motor Left/Right Comparison   Stim Site L Lat (ms) R Lat (ms) L-R Lat (ms) L Amp (mV) R Amp (mV) L-R Amp (%) Site1 Site2 L Vel (m/s) R Vel (m/s) L-R Vel (m/s)  Median Motor (Abd Poll Brev)  32C  Wrist 3.7 3.4 0.3 8.4 7.2 14.3 Elbow Wrist 52 *49 3  Elbow 7.8 8.0 0.2 5.0 7.1 29.6       Ulnar Motor (Abd Dig Min)  31.3C  Wrist 2.8 3.8 *1.0 7.5 7.6 1.3 B Elbow Wrist *50 53 3  B Elbow 7.0 7.8 0.8 6.1 5.1 16.4 A Elbow B Elbow 58 59 1  A Elbow 8.9 9.5 0.6 6.3 6.6 4.5          Waveforms:

## 2020-08-05 NOTE — Progress Notes (Signed)
Jeffrey Wilkerson - 65 y.o. male MRN [id number]  Date of birth: 1955-08-20  Office Visit Note: Visit Date: 08/01/2020 PCP: Ozella Rocks, MD Referred by: Ozella Rocks, MD  Subjective: Chief Complaint  Patient presents with  . Left Elbow - Pain  . Right Elbow - Pain  . Right Shoulder - Pain  . Left Shoulder - Pain  . Right Hand - Pain   HPI:  Jeffrey Wilkerson is a 65 y.o. male who comes in today at the request of Dr. Burnard Bunting for electrodiagnostic study of the Bilateral upper extremities.  Patient is Right hand dominant.  He describes left-sided neck pain but Bilateral numbness and paresthesias in the fourth and fifth digits right equal to left.  He reports the symptoms in the hands travel up to both elbows at times.  He reports some pain into the shoulders.  He has no prior history of cervical surgery or diagnosis of nerve compression or polyneuropathy.  He has no history of prior electrodiagnostic study.  MRI of the cervical spine is pending.  He rates his pain as a 3 out of 10 but it does wax and wane.   ROS Otherwise per HPI.  Assessment & Plan: Visit Diagnoses:    ICD-10-CM   1. Paresthesia of skin  R20.2 NCV with EMG (electromyography)    Plan: Impression: Essentially NORMAL electrodiagnostic study of both upper limbs.  There is no significant electrodiagnostic evidence of nerve entrapment, brachial plexopathy or cervical radiculopathy.    As you know, purely sensory or demyelinating radiculopathies and chemical radiculitis may not be detected with this particular electrodiagnostic study.  Recommendations: 1.  Follow-up with referring physician. 2.  Continue current management of symptoms.  Meds & Orders: No orders of the defined types were placed in this encounter.   Orders Placed This Encounter  Procedures  . NCV with EMG (electromyography)    Follow-up: Return for  Burnard Bunting, MD as scheduled.   Procedures: No procedures performed  EMG & NCV  Findings: Evaluation of the right median motor nerve showed decreased conduction velocity (Elbow-Wrist, 49 m/s).  The left ulnar motor nerve showed borderline decreased conduction velocity (B Elbow-Wrist, 50 m/s).  The left median (across palm) sensory nerve showed prolonged distal peak latency (4.1 ms).  The right median (across palm) sensory nerve showed prolonged distal peak latency (Wrist, 3.7 ms).  The left radial sensory nerve showed no response (Wrist).  The right ulnar sensory nerve showed borderline prolonged distal peak latency (3.8 ms), reduced amplitude (14.6 V), and decreased conduction velocity (Wrist-5th Digit, 37 m/s).  All remaining nerves (as indicated in the following tables) were within normal limits.  Left vs. Right side comparison data for the ulnar motor nerve indicates abnormal L-R latency difference (1.0 ms).  The ulnar sensory nerve indicates abnormal L-R latency difference (0.5 ms).  All remaining left vs. right side differences were within normal limits.    All examined muscles (as indicated in the following table) showed no evidence of electrical instability.    Impression: Essentially NORMAL electrodiagnostic study of both upper limbs.  There is no significant electrodiagnostic evidence of nerve entrapment, brachial plexopathy or cervical radiculopathy.    As you know, purely sensory or demyelinating radiculopathies and chemical radiculitis may not be detected with this particular electrodiagnostic study.  Recommendations: 1.  Follow-up with referring physician. 2.  Continue current management of symptoms.  ___________________________ Elease Hashimoto Board Certified, American Board of Physical Medicine and  Rehabilitation    Nerve Conduction Studies Anti Sensory Summary Table   Stim Site NR Peak (ms) Norm Peak (ms) P-T Amp (V) Norm P-T Amp Site1 Site2 Delta-P (ms) Dist (cm) Vel (m/s) Norm Vel (m/s)  Left Median Acr Palm Anti Sensory (2nd Digit)  32.2C   Wrist    *4.1 <3.6 16.3 >10        Right Median Acr Palm Anti Sensory (2nd Digit)  31.6C  Wrist    *3.7 <3.6 20.1 >10 Wrist Palm 1.7 0.0    Palm    2.0 <2.0 25.1         Left Radial Anti Sensory (Base 1st Digit)  31.7C  Wrist *NR  <3.1   Wrist Base 1st Digit  0.0    Right Radial Anti Sensory (Base 1st Digit)  31.6C  Wrist    2.7 <3.1 18.1  Wrist Base 1st Digit 2.7 0.0    Left Ulnar Anti Sensory (5th Digit)  32.6C  Wrist    3.3 <3.7 17.4 >15.0 Wrist 5th Digit 3.3 14.0 42 >38  Right Ulnar Anti Sensory (5th Digit)  31.6C  Wrist    *3.8 <3.7 *14.6 >15.0 Wrist 5th Digit 3.8 14.0 *37 >38   Motor Summary Table   Stim Site NR Onset (ms) Norm Onset (ms) O-P Amp (mV) Norm O-P Amp Site1 Site2 Delta-0 (ms) Dist (cm) Vel (m/s) Norm Vel (m/s)  Left Median Motor (Abd Poll Brev)  32C  Wrist    3.7 <4.2 8.4 >5 Elbow Wrist 4.1 21.5 52 >50  Elbow    7.8  5.0         Right Median Motor (Abd Poll Brev)  31.5C  Wrist    3.4 <4.2 7.2 >5 Elbow Wrist 4.6 22.5 *49 >50  Elbow    8.0  7.1         Left Ulnar Motor (Abd Dig Min)  31.3C  Wrist    2.8 <4.2 7.5 >3 B Elbow Wrist 4.2 21.0 *50 >53  B Elbow    7.0  6.1  A Elbow B Elbow 1.9 11.0 58 >53  A Elbow    8.9  6.3         Right Ulnar Motor (Abd Dig Min)  31.5C  Wrist    3.8 <4.2 7.6 >3 B Elbow Wrist 4.0 21.0 53 >53  B Elbow    7.8  5.1  A Elbow B Elbow 1.7 10.0 59 >53  A Elbow    9.5  6.6          EMG   Side Muscle Nerve Root Ins Act Fibs Psw Amp Dur Poly Recrt Int Dennie Bible Comment  Right Abd Poll Brev Median C8-T1 Nml Nml Nml Nml Nml 0 Nml Nml   Right 1stDorInt Ulnar C8-T1 Nml Nml Nml Nml Nml 0 Nml Nml   Right PronatorTeres Median C6-7 Nml Nml Nml Nml Nml 0 Nml Nml   Right Biceps Musculocut C5-6 Nml Nml Nml Nml Nml 0 Nml Nml   Right Deltoid Axillary C5-6 Nml Nml Nml Nml Nml 0 Nml Nml  Nerve Conduction Studies Anti Sensory Left/Right Comparison   Stim Site L Lat (ms) R  Lat (ms) L-R Lat (ms) L Amp (V) R Amp (V) L-R Amp (%) Site1 Site2 L Vel (m/s) R Vel (m/s) L-R Vel (m/s)  Median Acr Palm Anti Sensory (2nd Digit)  32.2C  Wrist *4.1 *3.7 0.4 16.3 20.1 18.9       Radial Anti Sensory (Base 1st Digit)  31.7C  Wrist  2.7   18.1  Wrist Base 1st Digit     Ulnar Anti Sensory (5th Digit)  32.6C  Wrist 3.3 *3.8 *0.5 17.4 *14.6 16.1 Wrist 5th Digit 42 *37 5   Motor Left/Right Comparison   Stim Site L Lat (ms) R Lat (ms) L-R Lat (ms) L Amp (mV) R Amp (mV) L-R Amp (%) Site1 Site2 L Vel (m/s) R Vel (m/s) L-R Vel (m/s)  Median Motor (Abd Poll Brev)  32C  Wrist 3.7 3.4 0.3 8.4 7.2 14.3 Elbow Wrist 52 *49 3  Elbow 7.8 8.0 0.2 5.0 7.1 29.6       Ulnar Motor (Abd Dig Min)  31.3C  Wrist 2.8 3.8 *1.0 7.5 7.6 1.3 B Elbow Wrist *50 53 3  B Elbow 7.0 7.8 0.8 6.1 5.1 16.4 A Elbow B Elbow 58 59 1  A Elbow 8.9 9.5 0.6 6.3 6.6 4.5          Waveforms:                      Clinical History: No specialty comments available.     Objective:  VS:  HT:    WT:   BMI:     BP:   HR: bpm  TEMP: ( )  RESP:  Physical Exam Musculoskeletal:        General: No tenderness.     Comments: Inspection reveals no atrophy of the bilateral APB or FDI or hand intrinsics.  There are trigger points noted in the left more than right trapezius and levator scapula.  There is no swelling, color changes, allodynia or dystrophic changes. There is 5 out of 5 strength in the bilateral wrist extension, finger abduction and long finger flexion. There is intact sensation to light touch in all dermatomal and peripheral nerve distributions. There is a negative Froment's test bilaterally.  There is a negative Phalen's test bilaterally. There is a negative Hoffmann's test bilaterally.  Skin:    General: Skin is warm and dry.     Findings: No erythema or rash.  Neurological:     General: No focal deficit present.     Mental Status: He is alert and oriented to person, place, and time.      Sensory: No sensory deficit.     Motor: No weakness or abnormal muscle tone.     Coordination: Coordination normal.     Gait: Gait normal.  Psychiatric:        Mood and Affect: Mood normal.        Behavior: Behavior normal.        Thought Content: Thought content normal.      Imaging: MR Cervical Spine w/o contrast  Result Date: 08/04/2020 CLINICAL DATA:  Radiculopathy, cervical region. Cervical radiculopathy, no red flags; bilateral radiculopathy. Additional history provided: Patient reports pain and tightness in left side of neck, radiating into shoulder; tingling and ring finger and little finger of left hand; symptoms for 6-12 months. EXAM: MRI CERVICAL SPINE WITHOUT CONTRAST TECHNIQUE: Multiplanar, multisequence MR imaging of the cervical spine  was performed. No intravenous contrast was administered. COMPARISON:  Radiographs of the cervical spine 06/25/2020. FINDINGS: The examination is intermittently motion degraded. Most notably, there is moderate motion degradation of the sagittal T2 weighted sequence. Alignment: Trace C4-C5 grade 1 retrolisthesis. Trace C5-C6 grade 1 anterolisthesis. Vertebrae: Vertebral body height is maintained. No marrow edema or focal suspicious osseous lesion. Cord: No spinal cord signal abnormality is identified. Posterior Fossa, vertebral arteries, paraspinal tissues: No abnormality identified within included portions of the posterior fossa. Flow voids preserved within the imaged cervical vertebral arteries. Disc levels: Multilevel disc degeneration. Most notably, there is mild/moderate disc degeneration at C5-C6 and moderate disc degeneration at C6-C7. C2-C3: No significant disc herniation or stenosis. C3-C4: Disc bulge. Right-sided disc osteophyte ridge/uncinate hypertrophy. No significant spinal canal stenosis. Moderate/severe right neural foraminal narrowing. C4-C5: Trace retrolisthesis. Mild disc bulge and uncovertebral hypertrophy. Minimal facet hypertrophy  (predominantly on the left). No significant spinal canal stenosis. Mild/moderate left neural foraminal narrowing. C5-C6: Trace grade 1 anterolisthesis. Disc uncovering with slight disc bulge. Mild uncovertebral hypertrophy (greater to the left. Mild facet hypertrophy (predominantly on the left). No significant spinal. Canal stenosis. Mild relative left neural foraminal narrowing. C6-C7: Disc bulge with endplate spurring. Right foraminal disc protrusion with associated osteophyte ridge. Right greater than left uncinate hypertrophy. Mild relative spinal canal narrowing. Severe right neural foraminal narrowing. C7-T1: Central posterior annular fissure. Minimal disc bulge. No significant spinal canal or foraminal stenosis. IMPRESSION: Intermittently motion degraded exam. Cervical spondylosis as described and most notably as follows. At C6-C7, there is moderate disc degeneration. A right foraminal disc protrusion with associated osteophyte ridge and uncinate hypertrophy contribute to severe right neural foraminal narrowing. Mild relative spinal canal narrowing. No significant spinal canal stenosis at the remaining levels. Additional sites of neural foraminal narrowing as described and greatest on the right at C3-C4 (moderate/severe) and on the left at C4-C5 (mild/moderate). Electronically Signed   By: Jackey Loge DO   On: 08/04/2020 12:30

## 2020-08-07 ENCOUNTER — Ambulatory Visit: Payer: BC Managed Care – PPO | Admitting: Orthopedic Surgery

## 2020-08-18 ENCOUNTER — Other Ambulatory Visit: Payer: Self-pay

## 2020-08-18 ENCOUNTER — Ambulatory Visit (INDEPENDENT_AMBULATORY_CARE_PROVIDER_SITE_OTHER): Payer: 59 | Admitting: Orthopedic Surgery

## 2020-08-18 ENCOUNTER — Encounter: Payer: Self-pay | Admitting: Orthopedic Surgery

## 2020-08-18 DIAGNOSIS — M7711 Lateral epicondylitis, right elbow: Secondary | ICD-10-CM | POA: Diagnosis not present

## 2020-08-18 DIAGNOSIS — R200 Anesthesia of skin: Secondary | ICD-10-CM | POA: Diagnosis not present

## 2020-08-18 NOTE — Progress Notes (Signed)
Office Visit Note   Patient: Jeffrey Wilkerson           Date of Birth: 06-28-1956           MRN: [id number] Visit Date: 08/18/2020 Requested by: Ozella Rocks, MD [address] [place],  [place] [address] PCP: Ozella Rocks, MD  Subjective: Chief Complaint  Patient presents with  . Other     Scan review    HPI: Jeffrey Wilkerson is a 65 y.o. male who presents to the office complaining of continued neck pain and numbness/tingling.  Patient returns to discuss MRI of the cervical spine.  He does state that he got a new pillow recently that has significantly helped his primarily left-sided neck pain.  His main complaint now is continued numbness and tingling in the fourth and fifth fingers of bilateral hands.  He sometimes notices numbness and tingling in his first through third fingers but primarily it happens in the fourth and fifth fingers.  It is primarily in the morning and improves throughout the day.  Patient also reports right-sided elbow pain that he has noticed more recently.  Localizes this pain to the lateral epicondyle with increased pain with strong grip.  No recent injury.  No significant weakness.  He has used a tennis elbow brace in the past..                ROS: All systems reviewed are negative as they relate to the chief complaint within the history of present illness.  Patient denies fevers or chills.  Assessment & Plan: Visit Diagnoses:  1. Bilateral hand numbness   2. Right tennis elbow     Plan: Patient is a 65 year old male who presents complaining of left-sided neck pain and bilateral fourth and fifth finger numbness/tingling.  He had recent MRI of the cervical spine that was reviewed today which revealed cervical spondylosis, severe right neuroforaminal narrowing at C6-C7, moderate to severe neuroforaminal narrowing on the right at C3-C4, moderate foraminal narrowing on the left at C4-C5.  Discussed options available to patient.  He would like to try  cervical spine ESI's to see if this will help with his hand numbness and tingling.  He also had a nerve conduction study that was negative for any nerve compression throughout the bilateral arms.  Plan to refer patient to Dr. Naaman Plummer for C-spine ESI's to see if this will help with his lessened neck pain and continued numbness and tingling.  Patient also has increased pain from likely right tennis elbow.  He has experienced this in the past.  He has tried stretching exercises and wants to try and rehab this on his own.  Recommended he contact the office for radiographs and to try physical therapy if pain does not abate over the next 4 to 6 weeks.  Also discussed other modalities that may help with the pain.  Follow-up with Dr. Alvester Morin.  Follow-Up Instructions: No follow-ups on file.   Orders:  No orders of the defined types were placed in this encounter.  No orders of the defined types were placed in this encounter.     Procedures: No procedures performed   Clinical Data: No additional findings.  Objective: Vital Signs: There were no vitals taken for this visit.  Physical Exam:  Constitutional: Patient appears well-developed HEENT:  Head: Normocephalic Eyes:EOM are normal Neck: Normal range of motion Cardiovascular: Normal rate Pulmonary/chest: Effort normal Neurologic: Patient is alert Skin: Skin is warm Psychiatric:  Patient has normal mood and affect  Ortho Exam: Ortho exam demonstrates right elbow with tenderness over the lateral epicondyle.  No tenderness over the medial epicondyle.  No tenderness over the radial nerve.  Pain with resisted wrist extension and finger extension that is localized to the lateral epicondyle.  Specialty Comments:  No specialty comments available.  Imaging: No results found.   PMFS History: There are no problems to display for this patient.  Past Medical History:  Diagnosis Date  . High cholesterol   . History of kidney stones   .  Hypertension   . Sciatica   . Sleep apnea     No family history on file.  Past Surgical History:  Procedure Laterality Date  . CYSTOSCOPY/URETEROSCOPY/HOLMIUM LASER/STENT PLACEMENT Right 08/22/2019   Procedure: CYSTOSCOPY/URETEROSCOPY/HOLMIUM LASER/STENT PLACEMENT;  Surgeon: Rene Paci, MD;  Location: [facility];  Service: Urology;  Laterality: Right;  . TONSILLECTOMY  age 50   Social History   Occupational History  . Not on file  Tobacco Use  . Smoking status: Never Smoker  . Smokeless tobacco: Never Used  Vaping Use  . Vaping Use: Never used  Substance and Sexual Activity  . Alcohol use: Yes    Comment: occ  . Drug use: No  . Sexual activity: Not on file

## 2020-08-29 ENCOUNTER — Telehealth: Payer: Self-pay | Admitting: Physical Medicine and Rehabilitation

## 2020-08-29 DIAGNOSIS — M5412 Radiculopathy, cervical region: Secondary | ICD-10-CM

## 2020-08-29 NOTE — Telephone Encounter (Signed)
I have put in order for cervical ESI

## 2020-08-29 NOTE — Telephone Encounter (Signed)
Jeffrey Wilkerson saw the patient on 2/14 and mentioned follow up with Dr. Alvester Morin. No referral was placed. Patient has only seen Dr. Alvester Morin for EMG/ NCV. Please advise.

## 2020-08-29 NOTE — Telephone Encounter (Signed)
Patient called asked for a call back concerning scheduling an appointment for a neck injection. The number to contact patient is [phone]

## 2020-09-01 NOTE — Telephone Encounter (Signed)
Pt not req Auth#. 

## 2020-09-05 ENCOUNTER — Telehealth: Payer: Self-pay | Admitting: Physical Medicine and Rehabilitation

## 2020-09-05 NOTE — Telephone Encounter (Signed)
Pt called Jeffrey Wilkerson back about scheduling an appt and would like a call back.

## 2020-09-08 NOTE — Telephone Encounter (Signed)
Called pt and sch 3/21 °

## 2020-09-22 ENCOUNTER — Ambulatory Visit: Payer: 59 | Admitting: Physical Medicine and Rehabilitation

## 2020-09-22 ENCOUNTER — Ambulatory Visit: Payer: Self-pay

## 2020-09-22 ENCOUNTER — Other Ambulatory Visit: Payer: Self-pay

## 2020-09-22 ENCOUNTER — Encounter: Payer: Self-pay | Admitting: Physical Medicine and Rehabilitation

## 2020-09-22 VITALS — BP 134/79 | HR 61

## 2020-09-22 DIAGNOSIS — M5412 Radiculopathy, cervical region: Secondary | ICD-10-CM | POA: Diagnosis not present

## 2020-09-22 MED ORDER — BETAMETHASONE SOD PHOS & ACET 6 (3-3) MG/ML IJ SUSP
12.0000 mg | Freq: Once | INTRAMUSCULAR | Status: AC
  Administered 2020-09-22: 12 mg

## 2020-09-22 NOTE — Progress Notes (Signed)
Jeffrey Wilkerson - 65 y.o. male MRN [id number]  Date of birth: 12-29-1955  Office Visit Note: Visit Date: 09/22/2020 PCP: Ozella Rocks, MD Referred by: Ozella Rocks, MD  Subjective: Chief Complaint  Patient presents with  . Neck - Pain  . Right Shoulder - Pain  . Left Shoulder - Pain  . Right Hand - Tingling  . Left Hand - Tingling   HPI:  Jeffrey Wilkerson is a 65 y.o. male who comes in today at the request of Dr. Burnard Bunting for planned Right C7-T1 Cervical epidural steroid injection with fluoroscopic guidance.  The patient has failed conservative care including home exercise, medications, time and activity modification.  This injection will be diagnostic and hopefully therapeutic.  Please see requesting physician notes for further details and justification. MRI reviewed with images and spine model.  MRI reviewed in the note below.    ROS Otherwise per HPI.  Assessment & Plan: Visit Diagnoses:    ICD-10-CM   1. Cervical radiculopathy  M54.12 XR C-ARM NO REPORT    Epidural Steroid injection    betamethasone acetate-betamethasone sodium phosphate (CELESTONE) injection 12 mg    Plan: No additional findings.   Meds & Orders:  Meds ordered this encounter  Medications  . betamethasone acetate-betamethasone sodium phosphate (CELESTONE) injection 12 mg    Orders Placed This Encounter  Procedures  . XR C-ARM NO REPORT  . Epidural Steroid injection    Follow-up: Return for visit to requesting physician as needed.   Procedures: No procedures performed  Cervical Epidural Steroid Injection - Interlaminar Approach with Fluoroscopic Guidance  Patient: Jeffrey Wilkerson      Date of Birth: 20-Nov-1955 MRN: [id number] PCP: Ozella Rocks, MD      Visit Date: 09/22/2020   Universal Protocol:    Date/Time: 09/22/2209:14 AM  Consent Given By: the patient  Position: PRONE  Additional Comments: Vital signs were monitored before and after the procedure. Patient was  prepped and draped in the usual sterile fashion. The correct patient, procedure, and site was verified.   Injection Procedure Details:   Procedure diagnoses: Cervical radiculopathy [M54.12]    Meds Administered:  Meds ordered this encounter  Medications  . betamethasone acetate-betamethasone sodium phosphate (CELESTONE) injection 12 mg     Laterality: Right  Location/Site: C7-T1  Needle: 3.5 in., 20 ga. Tuohy  Needle Placement: Paramedian epidural space  Findings:  -Comments: Excellent flow of contrast into the epidural space.  Procedure Details: Using a paramedian approach from the side mentioned above, the region overlying the inferior lamina was localized under fluoroscopic visualization and the soft tissues overlying this structure were infiltrated with 4 ml. of 1% Lidocaine without Epinephrine. A # 20 gauge, Tuohy needle was inserted into the epidural space using a paramedian approach.  The epidural space was localized using loss of resistance along with contralateral oblique bi-planar fluoroscopic views.  After negative aspirate for air, blood, and CSF, a 2 ml. volume of Isovue-250 was injected into the epidural space and the flow of contrast was observed. Radiographs were obtained for documentation purposes.   The injectate was administered into the level noted above.  Additional Comments:  The patient tolerated the procedure well Dressing: 2 x 2 sterile gauze and Band-Aid    Post-procedure details: Patient was observed during the procedure. Post-procedure instructions were reviewed.  Patient left the clinic in stable condition.     Clinical History: MRI CERVICAL SPINE WITHOUT CONTRAST  TECHNIQUE: Multiplanar, multisequence MR imaging  of the cervical spine was performed. No intravenous contrast was administered.  COMPARISON:  Radiographs of the cervical spine 06/25/2020.  FINDINGS: The examination is intermittently motion degraded. Most notably, there  is moderate motion degradation of the sagittal T2 weighted sequence.  Alignment: Trace C4-C5 grade 1 retrolisthesis. Trace C5-C6 grade 1 anterolisthesis.  Vertebrae: Vertebral body height is maintained. No marrow edema or focal suspicious osseous lesion.  Cord: No spinal cord signal abnormality is identified.  Posterior Fossa, vertebral arteries, paraspinal tissues: No abnormality identified within included portions of the posterior fossa. Flow voids preserved within the imaged cervical vertebral arteries.  Disc levels:  Multilevel disc degeneration. Most notably, there is mild/moderate disc degeneration at C5-C6 and moderate disc degeneration at C6-C7.  C2-C3: No significant disc herniation or stenosis.  C3-C4: Disc bulge. Right-sided disc osteophyte ridge/uncinate hypertrophy. No significant spinal canal stenosis. Moderate/severe right neural foraminal narrowing.  C4-C5: Trace retrolisthesis. Mild disc bulge and uncovertebral hypertrophy. Minimal facet hypertrophy (predominantly on the left). No significant spinal canal stenosis. Mild/moderate left neural foraminal narrowing.  C5-C6: Trace grade 1 anterolisthesis. Disc uncovering with slight disc bulge. Mild uncovertebral hypertrophy (greater to the left. Mild facet hypertrophy (predominantly on the left). No significant spinal. Canal stenosis. Mild relative left neural foraminal narrowing.  C6-C7: Disc bulge with endplate spurring. Right foraminal disc protrusion with associated osteophyte ridge. Right greater than left uncinate hypertrophy. Mild relative spinal canal narrowing. Severe right neural foraminal narrowing.  C7-T1: Central posterior annular fissure. Minimal disc bulge. No significant spinal canal or foraminal stenosis.  IMPRESSION: Intermittently motion degraded exam.  Cervical spondylosis as described and most notably as follows.  At C6-C7, there is moderate disc degeneration. A right  foraminal disc protrusion with associated osteophyte ridge and uncinate hypertrophy contribute to severe right neural foraminal narrowing. Mild relative spinal canal narrowing.  No significant spinal canal stenosis at the remaining levels. Additional sites of neural foraminal narrowing as described and greatest on the right at C3-C4 (moderate/severe) and on the left at C4-C5 (mild/moderate).   Electronically Signed   By: Jackey Loge DO   On: 08/04/2020 12:30     Objective:  VS:  HT:    WT:   BMI:     BP:134/79  HR:61bpm  TEMP: ( )  RESP:  Physical Exam Vitals and nursing note reviewed.  Constitutional:      General: He is not in acute distress.    Appearance: Normal appearance. He is not ill-appearing.  HENT:     Head: Normocephalic and atraumatic.     Right Ear: External ear normal.     Left Ear: External ear normal.  Eyes:     Extraocular Movements: Extraocular movements intact.  Cardiovascular:     Rate and Rhythm: Normal rate.     Pulses: Normal pulses.  Abdominal:     General: There is no distension.     Palpations: Abdomen is soft.  Musculoskeletal:        General: No signs of injury.     Cervical back: Neck supple. Tenderness present. No rigidity.     Right lower leg: No edema.     Left lower leg: No edema.     Comments: Patient has good strength in the upper extremities with 5 out of 5 strength in wrist extension long finger flexion APB.  No intrinsic hand muscle atrophy.  Negative Hoffmann's test.  Lymphadenopathy:     Cervical: No cervical adenopathy.  Skin:    Findings: No erythema or rash.  Neurological:     General: No focal deficit present.     Mental Status: He is alert and oriented to person, place, and time.     Sensory: No sensory deficit.     Motor: No weakness or abnormal muscle tone.     Coordination: Coordination normal.  Psychiatric:        Mood and Affect: Mood normal.        Behavior: Behavior normal.      Imaging: No  results found.

## 2020-09-22 NOTE — Progress Notes (Signed)
Pt state neck pain that travels to both shoulder and down to his hands. Pt state he feels tingling in his hands. Pt state pushing on things makes the pain worse. Pt state he take pain meds and excise to help ease the pain.  Numeric Pain Rating Scale and Functional Assessment Average Pain 5   In the last MONTH (on 0-10 scale) has pain interfered with the following?  1. General activity like being  able to carry out your everyday physical activities such as walking, climbing stairs, carrying groceries, or moving a chair?  Rating(8)   +Driver, -BT, -Dye Allergies.

## 2020-09-22 NOTE — Patient Instructions (Signed)

## 2020-09-22 NOTE — Procedures (Signed)
Cervical Epidural Steroid Injection - Interlaminar Approach with Fluoroscopic Guidance  Patient: Jeffrey Wilkerson      Date of Birth: [birth year] MRN: [id number] PCP: Ozella Rocks, MD      Visit Date: 09/22/2020   Universal Protocol:    Date/Time: 09/22/2209:14 AM  Consent Given By: the patient  Position: PRONE  Additional Comments: Vital signs were monitored before and after the procedure. Patient was prepped and draped in the usual sterile fashion. The correct patient, procedure, and site was verified.   Injection Procedure Details:   Procedure diagnoses: Cervical radiculopathy [M54.12]    Meds Administered:  Meds ordered this encounter  Medications  . betamethasone acetate-betamethasone sodium phosphate (CELESTONE) injection 12 mg     Laterality: Right  Location/Site: C7-T1  Needle: 3.5 in., 20 ga. Tuohy  Needle Placement: Paramedian epidural space  Findings:  -Comments: Excellent flow of contrast into the epidural space.  Procedure Details: Using a paramedian approach from the side mentioned above, the region overlying the inferior lamina was localized under fluoroscopic visualization and the soft tissues overlying this structure were infiltrated with 4 ml. of 1% Lidocaine without Epinephrine. A # 20 gauge, Tuohy needle was inserted into the epidural space using a paramedian approach.  The epidural space was localized using loss of resistance along with contralateral oblique bi-planar fluoroscopic views.  After negative aspirate for air, blood, and CSF, a 2 ml. volume of Isovue-250 was injected into the epidural space and the flow of contrast was observed. Radiographs were obtained for documentation purposes.   The injectate was administered into the level noted above.  Additional Comments:  The patient tolerated the procedure well Dressing: 2 x 2 sterile gauze and Band-Aid    Post-procedure details: Patient was observed during the  procedure. Post-procedure instructions were reviewed.  Patient left the clinic in stable condition.

## 2021-07-04 ENCOUNTER — Ambulatory Visit
Admission: EM | Admit: 2021-07-04 | Discharge: 2021-07-04 | Disposition: A | Discharge status: Home / Self Care | Payer: 59

## 2021-07-04 ENCOUNTER — Encounter: Payer: Self-pay | Admitting: Emergency Medicine

## 2021-07-04 ENCOUNTER — Other Ambulatory Visit: Payer: Self-pay

## 2021-07-04 ENCOUNTER — Ambulatory Visit (INDEPENDENT_AMBULATORY_CARE_PROVIDER_SITE_OTHER): Payer: 59

## 2021-07-04 DIAGNOSIS — M79645 Pain in left finger(s): Secondary | ICD-10-CM

## 2021-07-04 DIAGNOSIS — S60451A Superficial foreign body of left index finger, initial encounter: Secondary | ICD-10-CM

## 2021-07-04 DIAGNOSIS — Z23 Encounter for immunization: Secondary | ICD-10-CM

## 2021-07-04 DIAGNOSIS — L03012 Cellulitis of left finger: Secondary | ICD-10-CM

## 2021-07-04 DIAGNOSIS — S60459A Superficial foreign body of unspecified finger, initial encounter: Secondary | ICD-10-CM

## 2021-07-04 MED ORDER — TETANUS-DIPHTH-ACELL PERTUSSIS 5-2.5-18.5 LF-MCG/0.5 IM SUSY
0.5000 mL | PREFILLED_SYRINGE | Freq: Once | INTRAMUSCULAR | Status: AC
  Administered 2021-07-04: 0.5 mL via INTRAMUSCULAR

## 2021-07-04 MED ORDER — SULFAMETHOXAZOLE-TRIMETHOPRIM 800-160 MG PO TABS: 1.0000 | ORAL_TABLET | Freq: Two times a day (BID) | ORAL | 0 refills | Status: AC

## 2021-07-04 MED ORDER — HIBICLENS 4 % EX LIQD: Freq: Every day | CUTANEOUS | 0 refills | Status: AC | PRN

## 2021-07-04 NOTE — ED Triage Notes (Signed)
Pt has a splinter in his left pointer finger x 3 days. Last tetanus unknown.

## 2021-07-04 NOTE — ED Provider Notes (Signed)
[facility]    CSN: [id number] Arrival date & time: 07/04/21  0950      History   Chief Complaint Chief Complaint  Patient presents with   Foreign Body in Skin    HPI Jeffrey Wilkerson is a 65 y.o. male.   Patient presenting today with a possible splinter retained in his left pointer finger that occurred 2 to 3 days ago.  He states he was able to take out part of it but thinks it may be a little bit left in the finger.  Has had redness, swelling and a pocket of pus form in the area despite soaks, frequent washing and ointment.  Denies fever, chills, numbness, tingling, decreased range of motion.  Does not know when his last tetanus shot was but thinks it is out of date.   Past Medical History:  Diagnosis Date   High cholesterol    History of kidney stones    Hypertension    Sciatica    Sleep apnea     There are no problems to display for this patient.   Past Surgical History:  Procedure Laterality Date   CYSTOSCOPY/URETEROSCOPY/HOLMIUM LASER/STENT PLACEMENT Right 08/22/2019   Procedure: CYSTOSCOPY/URETEROSCOPY/HOLMIUM LASER/STENT PLACEMENT;  Surgeon: Rene Paci, MD;  Location: [facility];  Service: Urology;  Laterality: Right;   TONSILLECTOMY  age 59       Home Medications    Prior to Admission medications   Medication Sig Start Date End Date Taking? Authorizing Provider  aspirin EC 81 MG tablet Take 81 mg by mouth daily.   Yes [provider]  Atorvastatin Calcium (LIPITOR PO) Take by mouth.   Yes [provider]  chlorhexidine (HIBICLENS) 4 % external liquid Apply topically daily as needed. 07/04/21  Yes Particia Nearing, PA-C  Cholecalciferol (VITAMIN D3) 1.25 MG (50000 UT) CAPS Take 1 capsule by mouth once a week. 05/18/21  Yes [provider]  clomiPHENE (CLOMID) 50 MG tablet Take 25 mg by mouth daily. 06/19/21  Yes [provider]  lisinopril (ZESTRIL) 40 MG tablet Take 40 mg  by mouth daily. 05/05/21  Yes [provider]  LISINOPRIL PO Take by mouth.   Yes [provider]  Multiple Vitamin (MULTIVITAMIN) tablet Take 1 tablet by mouth daily.   Yes [provider]  oxybutynin (DITROPAN) 5 MG tablet Take 1 tablet (5 mg total) by mouth every 8 (eight) hours as needed for bladder spasms. 08/22/19  Yes Rene Paci, MD  sulfamethoxazole-trimethoprim (BACTRIM DS) 800-160 MG tablet Take 1 tablet by mouth 2 (two) times daily. 07/04/21  Yes Particia Nearing, PA-C  HYDROcodone-acetaminophen (NORCO) 5-325 MG tablet Take 1 tablet by mouth every 4 (four) hours as needed for moderate pain. 08/22/19   Rene Paci, MD    Family History History reviewed. No pertinent family history.  Social History Social History   Tobacco Use   Smoking status: Never   Smokeless tobacco: Never  Vaping Use   Vaping Use: Never used  Substance Use Topics   Alcohol use: Yes    Comment: occ   Drug use: No     Allergies   Patient has no known allergies.   Review of Systems Review of Systems Per HPI  Physical Exam Triage Vital Signs ED Triage Vitals  Enc Vitals Group     BP 07/04/21 1121 130/83     Pulse Rate 07/04/21 1121 (!) 53     Resp 07/04/21 1121 16  Temp 07/04/21 1121 98.4 F (36.9 C)     Temp Source 07/04/21 1121 Oral     SpO2 07/04/21 1121 97 %     Weight --      Height --      Head Circumference --      Peak Flow --      Pain Score 07/04/21 1119 2     Pain Loc --      Pain Edu? --      Excl. in GC? --    No data found.  Updated Vital Signs BP 130/83 (BP Location: Right Arm)    Pulse (!) 53    Temp 98.4 F (36.9 C) (Oral)    Resp 16    SpO2 97%   Visual Acuity Right Eye Distance:   Left Eye Distance:   Bilateral Distance:    Right Eye Near:   Left Eye Near:    Bilateral Near:     Physical Exam Vitals and nursing note reviewed.  Constitutional:      Appearance: Normal appearance.  HENT:      Head: Atraumatic.  Eyes:     Extraocular Movements: Extraocular movements intact.     Conjunctiva/sclera: Conjunctivae normal.  Cardiovascular:     Rate and Rhythm: Normal rate and regular rhythm.     Pulses: Normal pulses.  Pulmonary:     Effort: Pulmonary effort is normal.     Breath sounds: Normal breath sounds.  Musculoskeletal:        General: Swelling, tenderness and signs of injury present. Normal range of motion.     Cervical back: Normal range of motion and neck supple.     Comments: Distal left index finger edematous, erythematous, significantly tender to palpation.  Skin:    General: Skin is warm and dry.     Findings: Erythema present.     Comments: Scabbed area of entry wound from the splinter, with adjacent collection of purulent fluid essentially to the area of erythema, edema.  No active drainage or bleeding  Neurological:     General: No focal deficit present.     Mental Status: He is oriented to person, place, and time.     Sensory: No sensory deficit.  Psychiatric:        Mood and Affect: Mood normal.        Thought Content: Thought content normal.        Judgment: Judgment normal.     UC Treatments / Results  Labs (all labs ordered are listed, but only abnormal results are displayed) Labs Reviewed - No data to display  EKG   Radiology DG Finger Index Left  Result Date: 07/04/2021 CLINICAL DATA:  65 year old male with history of pain and swelling with redness and tenderness in the distal aspect of the left index finger for the past several days. Evaluate for retained splinter. EXAM: LEFT INDEX FINGER 2+V COMPARISON:  No priors. FINDINGS: There is no evidence of fracture or dislocation. There is no evidence of arthropathy or other focal bone abnormality. Soft tissues are unremarkable. IMPRESSION: Negative. Electronically Signed   By: Trudie Reed M.D.   On: 07/04/2021 12:28    Procedures Incision and Drainage  Date/Time: 07/04/2021 3:23  PM Performed by: Particia Nearing, PA-C Authorized by: Particia Nearing, PA-C   Consent:    Consent obtained:  Verbal   Consent given by:  Patient   Risks, benefits, and alternatives were discussed: yes     Risks discussed:  Bleeding,  incomplete drainage, pain and infection   Alternatives discussed:  Alternative treatment Universal protocol:    Procedure explained and questions answered to patient or proxy's satisfaction: yes     Relevant documents present and verified: yes     Patient identity confirmed:  Verbally with patient Location:    Type:  Abscess   Size:  0.25 cm   Location:  Upper extremity   Upper extremity location:  Finger   Finger location:  L index finger Pre-procedure details:    Skin preparation:  Chlorhexidine with alcohol Sedation:    Sedation type:  None Anesthesia:    Anesthesia method:  Local infiltration   Local anesthetic:  Lidocaine 2% w/o epi Procedure type:    Complexity:  Simple Procedure details:    Incision types:  Stab incision   Incision depth:  Dermal   Wound management:  Probed and deloculated   Drainage:  Bloody   Drainage amount:  Scant   Wound treatment:  Wound left open   Packing materials:  None Post-procedure details:    Procedure completion:  Tolerated well, no immediate complications (including critical care time)  Medications Ordered in UC Medications  Tdap (BOOSTRIX) injection 0.5 mL (0.5 mLs Intramuscular Given 07/04/21 1240)    Initial Impression / Assessment and Plan / UC Course  I have reviewed the triage vital signs and the nursing notes.  Pertinent labs & imaging results that were available during my care of the patient were reviewed by me and considered in my medical decision making (see chart for details).     Procedure performed without difficulty, no foreign body noted on x-ray of the finger or exploratory procedure.  Wound left open, antibiotics given for 10 days with Hibiclens and discussed home  wound care, keeping area covered with ointment and bandaging.  Close follow-up with PCP recommended.  Tdap updated. Final Clinical Impressions(s) / UC Diagnoses   Final diagnoses:  Foreign body in skin of finger, initial encounter  Cellulitis of finger of left hand   Discharge Instructions   None    ED Prescriptions     Medication Sig Dispense Auth. Provider   sulfamethoxazole-trimethoprim (BACTRIM DS) 800-160 MG tablet Take 1 tablet by mouth 2 (two) times daily. 20 tablet Particia Nearing, [place]   chlorhexidine (HIBICLENS) 4 % external liquid Apply topically daily as needed. 120 mL Particia Nearing, [place]      PDMP not reviewed this encounter.   Particia Nearing, [place] 07/04/21 1525

## 2022-05-12 IMAGING — DX DG FINGER INDEX 2+V*L*
3 series · 3 of 3 positions shown · non-contrast
Comparison: No priors.

CLINICAL DATA: 65-year-old male with history of pain and swelling
with redness and tenderness in the distal aspect of the left index
finger for the past several days. Evaluate for retained splinter.

EXAM:
LEFT INDEX FINGER 2+V

[finger pa]
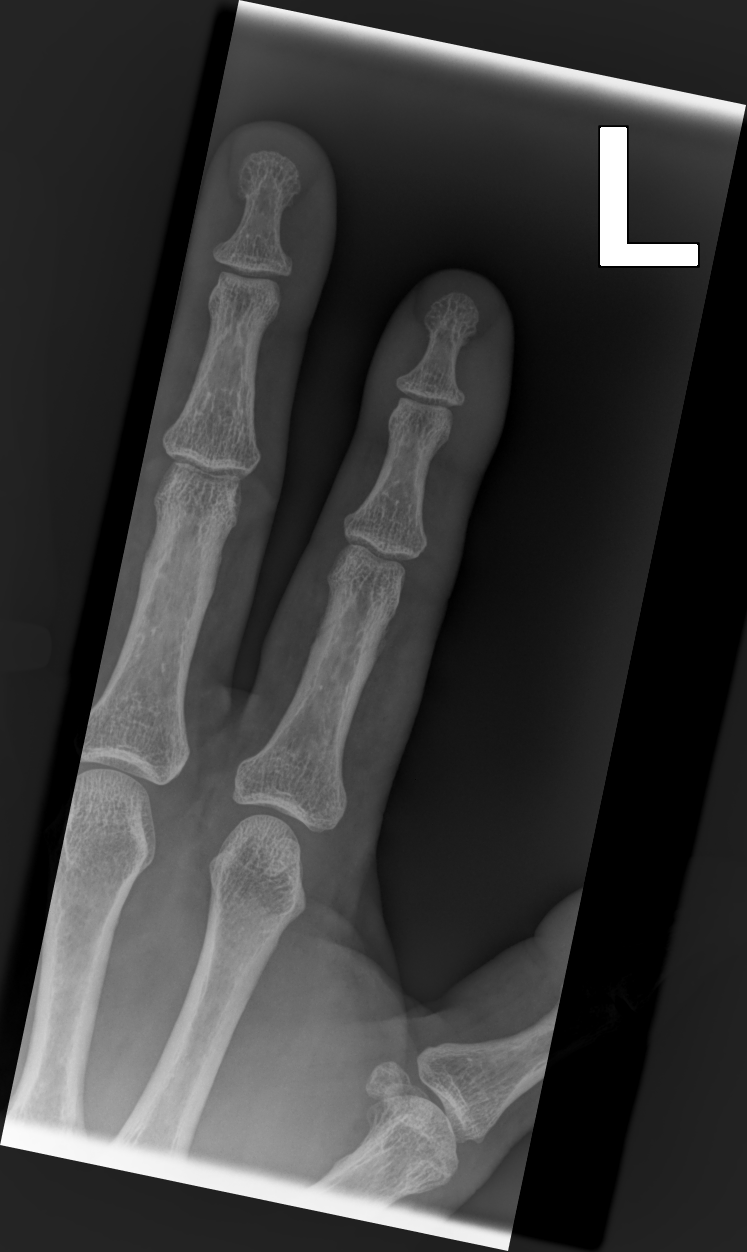

[finger mlo]
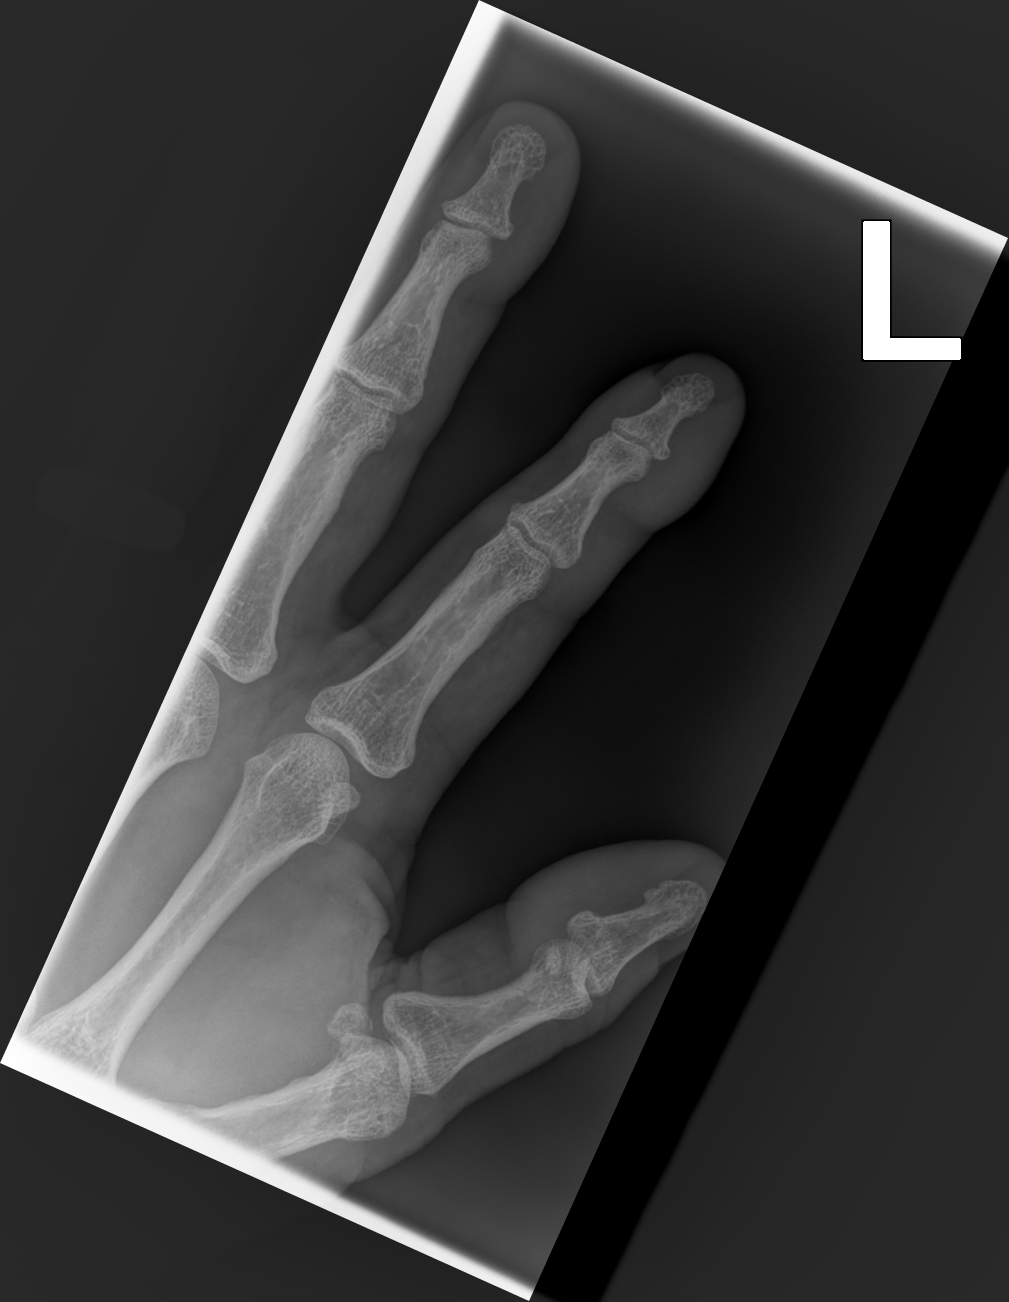

[2. finger lat]
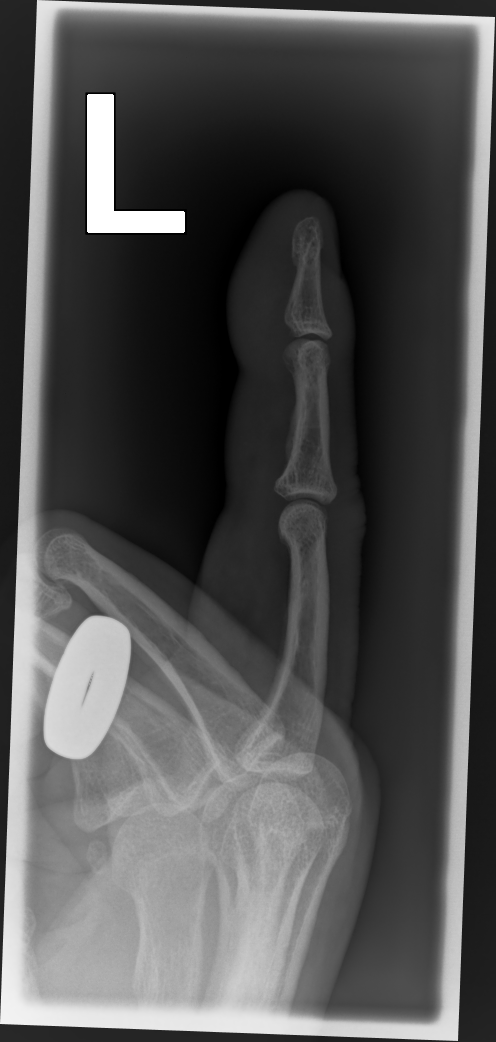

[3 of 3 positions shown; findings below may reference images not displayed]

FINDINGS: There is no evidence of fracture or dislocation. There is no
evidence of arthropathy or other focal bone abnormality. Soft
tissues are unremarkable.
IMPRESSION: Negative.

## 2023-07-28 ENCOUNTER — Encounter: Payer: Self-pay | Admitting: Neurology

## 2023-07-28 ENCOUNTER — Ambulatory Visit: Payer: 59 | Admitting: Neurology

## 2023-07-28 VITALS — BP 129/82 | HR 55 | Ht 70.0 in | Wt 199.6 lb

## 2023-07-28 DIAGNOSIS — R251 Tremor, unspecified: Secondary | ICD-10-CM | POA: Diagnosis not present

## 2023-07-28 NOTE — Patient Instructions (Signed)
You have a rather mild tremor of both hands. I do not see any signs or symptoms of parkinson's like disease or what we call parkinsonism.  In fact, your neurological exam is fairly normal today. For your tremor, I would not recommend any new medications at this time.   We could consider a low-dose of a beta-blocker in the near future if need be. This can be tried through your PCP as well.  Please keep your appointment for your yearly physical and I recommend that you get your thyroid function checked and your vitamin B12 level since you are taking a supplement.  We can see you in this clinic as needed.    Please remember, that any kind of tremor may be exacerbated by anxiety, anger, nervousness, excitement, dehydration, sleep deprivation, thyroid dysfunction, by caffeine, and low blood sugar values or blood sugar fluctuations. Some medications can exacerbate tremors, this includes certain asthma or COPD medications and certain antidepressants.

## 2023-07-28 NOTE — Progress Notes (Signed)
Subjective:    Patient ID: Jeffrey Wilkerson is a 68 y.o. male.  HPI    Huston Foley, MD, PhD [facility] [address], [address] [address] [place], [place] [address]  Dear Dr. Konrad Dolores,  I saw your patient, Jeffrey Wilkerson, upon your kind request in my neurologic clinic today for initial consultation of her tremor.  The patient is unaccompanied today.  As you know, Mr. [name] is a 68 year old RH male with an underlying medical history of palpitations, reflux disease, prediabetes, scoliosis, low vitamin D, osteoarthritis, chronic knee pain (followed by sports medicine), sciatica, chronic neck pain with upper extremity paresthesias (followed by orthopedics), hypertension, hyperlipidemia, history of kidney stones, sleep apnea, and obesity, who reports an approximately 2-year history of intermittent hand tremors, right more than left.  He does notice that tremors get worse when he is nervous or anxious or if he has not eaten well or not.  He reports compliance with a CPAP machine of many years.  He tries to hydrate well with water but does drink caffeine in the form of 1 large cup of coffee in the morning, 2 cans of soda per day and sometimes 1 glass of tea.  Not aware of any family history of tremors, his maternal grandmother had Parkinson's disease.  He usually gets routine blood work with his yearly physical which is due around this time. I reviewed your office note from 07/26/2023. Tremor is not particularly bothersome and does not prevent him from performing his work related activities or ADLs.  He works for [employer] in [employer] and also manages a farm.  He lives with his wife, they have 4 grown children, no one with tremors, he has an older brother who does not have any tremors.  He has not fallen.  He denies any balance issues.  He has not noticed any head or neck trauma or voice tremor or gait changes.  He does not currently take any Ditropan or hydrocodone,  these medications were given to him when he had a bout of kidney stones.  He is a non-smoker.    His Past Medical History Is Significant For: Past Medical History:  Diagnosis Date   High cholesterol    History of kidney stones    Hypertension    Sciatica    Sleep apnea     His Past Surgical History Is Significant For: Past Surgical History:  Procedure Laterality Date   CYSTOSCOPY/URETEROSCOPY/HOLMIUM LASER/STENT PLACEMENT Right 08/22/2019   Procedure: CYSTOSCOPY/URETEROSCOPY/HOLMIUM LASER/STENT PLACEMENT;  Surgeon: Rene Paci, MD;  Location: [facility];  Service: Urology;  Laterality: Right;   TONSILLECTOMY  age 65    His Family History Is Significant For: History reviewed. No pertinent family history.  His Social History Is Significant For: Social History   Socioeconomic History   Marital status: Married    Spouse name: Not on file   Number of children: Not on file   Years of education: Not on file   Highest education level: Not on file  Occupational History   Not on file  Tobacco Use   Smoking status: Never   Smokeless tobacco: Never  Vaping Use   Vaping status: Never Used  Substance and Sexual Activity   Alcohol use: Yes    Comment: occ   Drug use: No   Sexual activity: Not on file  Other Topics Concern   Not on file  Social History Narrative   Not on file   Social Drivers  of Health   Financial Resource Strain: Not on file  Food Insecurity: Not on file  Transportation Needs: Not on file  Physical Activity: Not on file  Stress: Not on file  Social Connections: Not on file    His Allergies Are:  No Known Allergies:   His Current Medications Are:  Outpatient Encounter Medications as of 07/28/2023  Medication Sig   aspirin EC 81 MG tablet Take 81 mg by mouth daily.   Atorvastatin Calcium (LIPITOR PO) Take by mouth.   Biotin 1 MG CAPS Take by mouth.   clomiPHENE (CLOMID) 50 MG tablet Take 25 mg by mouth daily.    Collagen-Vitamin C-Biotin (COLLAGEN 1500/C PO) Take by mouth.   collagenase (SANTYL) 250 UNIT/GM ointment Apply 1 Application topically daily.   Ergocalciferol 10 MCG (400 UNIT) TABS Take 1 tablet by mouth daily.   glucosamine-chondroitin 500-400 MG tablet Take 1 tablet by mouth 3 (three) times daily.   lisinopril (ZESTRIL) 40 MG tablet Take 40 mg by mouth daily.   Magnesium Glycinate 100 MG CAPS Take by mouth.   Multiple Vitamin (MULTIVITAMIN) tablet Take 1 tablet by mouth daily.   Turmeric 400 MG CAPS Take by mouth.   vitamin B-12 (CYANOCOBALAMIN) 100 MCG tablet Take 100 mcg by mouth daily.   chlorhexidine (HIBICLENS) 4 % external liquid Apply topically daily as needed. (Patient not taking: Reported on 07/28/2023)   Cholecalciferol (VITAMIN D3) 1.25 MG (50000 UT) CAPS Take 1 capsule by mouth once a week. (Patient not taking: Reported on 07/28/2023)   HYDROcodone-acetaminophen (NORCO) 5-325 MG tablet Take 1 tablet by mouth every 4 (four) hours as needed for moderate pain. (Patient not taking: Reported on 07/28/2023)   oxybutynin (DITROPAN) 5 MG tablet Take 1 tablet (5 mg total) by mouth every 8 (eight) hours as needed for bladder spasms. (Patient not taking: Reported on 07/28/2023)   sulfamethoxazole-trimethoprim (BACTRIM DS) 800-160 MG tablet Take 1 tablet by mouth 2 (two) times daily. (Patient not taking: Reported on 07/28/2023)   [DISCONTINUED] LISINOPRIL PO Take by mouth. (Patient not taking: Reported on 07/28/2023)   No facility-administered encounter medications on file as of 07/28/2023.  :   Review of Systems:  Out of a complete 14 point review of systems, all are reviewed and negative with the exception of these symptoms as listed below:  Review of Systems  Neurological:        Patient in room #9 and alone. Patient states over the past couples of years he's been having some shakes. Patient states when trying to hold a cup or silverware he can notice his shakes. Patient states it normal  happen when he is stress or after working out.    Objective:  Neurological Exam  Physical Exam  Physical Examination:   Vitals:   07/28/23 1122  BP: 129/82  Pulse: (!) 55    General Examination: The patient is a very pleasant 68 y.o. male in no acute distress. He appears well-developed and well-nourished and well groomed.   HEENT: Normocephalic, atraumatic, pupils are equal, round and reactive to light, extraocular tracking is good without limitation to gaze excursion or nystagmus noted. Hearing is grossly intact. Face is symmetric with normal facial animation and normal facial sensation to light touch, temperature and vibration sense. Speech is clear with no dysarthria noted. There is no hypophonia. There is no lip, neck/head, jaw or voice tremor. Neck is supple with full range of passive and active motion. There are no carotid bruits on auscultation. Oropharynx exam reveals:  Mild mouth dryness, good dental hygiene, moderate airway crowding.  Tongue protrudes centrally and palate elevates symmetrically.    Chest: Clear to auscultation without wheezing, rhonchi or crackles noted.  Heart: S1+S2+0, regular and normal without murmurs, rubs or gallops noted.   Abdomen: Soft, non-tender and non-distended.  Extremities: There is no pitting edema in the distal lower extremities bilaterally.   Skin: Warm and dry without trophic changes noted.   Musculoskeletal: exam reveals no obvious joint deformities.   Neurologically:  Mental status: The patient is awake, alert and oriented in all 4 spheres. His immediate and remote memory, attention, language skills and fund of knowledge are appropriate. There is no evidence of aphasia, agnosia, apraxia or anomia. Speech is clear with normal prosody and enunciation. Thought process is linear. Mood is normal and affect is normal.  Cranial nerves II - XII are as described above under HEENT exam.  Motor exam: Normal bulk, strength and tone is noted. There  is no obvious action or resting tremor.  No drift or rebound.  No significant postural or action tremor today, no intention tremor.  On 07/28/2023: On Archimedes spiral drawing he has a minimal trembling with the right hand, no significant trembling but insecurity with the left hand, handwriting is legible, not particularly tremulous and not micrographic. No resting tremor.  Fine motor skills and coordination: Intact finger taps, hand movements and rapid alternating patting with both upper extremities, normal foot taps bilaterally in the lower extremities.  Cerebellar testing: No dysmetria or intention tremor. There is no truncal or gait ataxia.  Normal finger-to-nose and normal heel-to-shin bilaterally.  Reflexes 1+ throughout, toes are downgoing bilaterally.  Romberg is negative.  Sensory exam: intact to light touch, temperature and vibration sense in the upper and lower extremities.  Gait, station and balance: He stands easily. No veering to one side is noted. No leaning to one side is noted. Posture is age-appropriate and stance is narrow based. Gait shows normal stride length and normal pace. No problems turning are noted.  Normal tandem walk.  Assessment and Plan:   In summary, EMONTE STRACKE is a very pleasant 68 y.o.-year old male with an underlying medical history of palpitations, reflux disease, prediabetes, scoliosis, low vitamin D, osteoarthritis, chronic knee pain (followed by sports medicine), sciatica, chronic neck pain with upper extremity paresthesias (followed by orthopedics), hypertension, hyperlipidemia, history of kidney stones, sleep apnea, and obesity, who presents for evaluation of his hand tremors of approximately 2 years duration.  He describes an intermittent tremor, no alarming symptoms.  Examination is benign today, no obvious tremor noted, with the exception of slight tremor with spiral drawing with the right hand.  He has no evidence of parkinsonism and he is reassured  in this regard.  I had a long chat with the patient regarding tremor conditions and symptomatic treatment options as well as triggers and alleviating factors.  At this juncture, I would not recommend a symptomatic medication for tremor control but he could be tried on a low-dose beta-blocker in the future.  He is encouraged to monitor his symptoms and keep his appointment for his physical with you.  I recommend that he gets his thyroid function checked and also his B12 level with the next round of blood work since he started taking a B12 supplement.  I have no indication for a brain scan at this time.  His history and exam are otherwise benign.  He is encouraged to scale back on his caffeine consumption a little  bit and make sure he stays really well hydrated, continue with his PAP therapy regularly for his sleep apnea.  He is advised to follow-up in this clinic as needed.  I answered all his questions today and he was in agreement with our plan.   Thank you very much for allowing me to participate in the care of this nice patient. If I can be of any further assistance to you please do not hesitate to call me at [phone].  Sincerely,   Huston Foley, MD, PhD

## 2024-01-25 ENCOUNTER — Other Ambulatory Visit (INDEPENDENT_AMBULATORY_CARE_PROVIDER_SITE_OTHER)

## 2024-01-25 ENCOUNTER — Ambulatory Visit: Admitting: Orthopedic Surgery

## 2024-01-25 ENCOUNTER — Other Ambulatory Visit: Payer: Self-pay

## 2024-01-25 ENCOUNTER — Telehealth: Payer: Self-pay

## 2024-01-25 DIAGNOSIS — M25551 Pain in right hip: Secondary | ICD-10-CM | POA: Diagnosis not present

## 2024-01-25 DIAGNOSIS — M25562 Pain in left knee: Secondary | ICD-10-CM | POA: Diagnosis not present

## 2024-01-25 NOTE — Telephone Encounter (Signed)
Auth needed for left knee gel 

## 2024-01-26 ENCOUNTER — Encounter: Payer: Self-pay | Admitting: Orthopedic Surgery

## 2024-01-26 NOTE — Progress Notes (Signed)
 Office Visit Note   Patient: Jeffrey Wilkerson           Date of Birth: 26-Jan-1956           MRN: [id number] Visit Date: 01/25/2024 Requested by: Remonia Alm PARAS, MD [address] [place],  [place] [address] PCP: Remonia Alm PARAS, MD  Subjective: Chief Complaint  Patient presents with   Left Knee - Pain   Right Hip - Pain    HPI: Jeffrey Wilkerson is a 68 y.o. male who presents to the office reporting left knee pain for 3 years.  He had a fall in 2018.  The pain comes and goes but is worse with ambulation.  Reports primarily medial sided knee pain.  Does describe some swelling but no locking or popping.  Does not wake him from sleep at night.  No prior knee surgery.  Patient also reports right hip pain for 3 to 4 weeks.  Localizes it discretely to the trochanteric region.  Denies any radicular pain no numbness and tingling no groin pain and no buttock pain.  No low back pain.  Ibuprofen helps some.  Has a history of left-sided sciatica years ago.  Has history of right-sided sciatica 15 years ago but exercise helps.  Has a history of having injections in the knee with the last one occurring last December.  Currently he is retired.  He has lost weight which has helped his knee pain to some degree.  That weight loss is 24 pounds..                ROS: All systems reviewed are negative as they relate to the chief complaint within the history of present illness.  Patient denies fevers or chills.  Assessment & Plan: Visit Diagnoses:  1. Left knee pain, unspecified chronicity   2. Pain in right hip     Plan: Impression is left knee pain with medial compartment arthritis.  He also has some right hip trochanteric bursitis.  We will get him set up for gel injection left knee.  Plan for ultrasound-guided trochanteric bursa injection today.  Follow-up in about 3 weeks for left-sided knee gel injection.  Follow-Up Instructions: No follow-ups on file.   Orders:  Orders Placed This Encounter   Procedures   XR HIP UNILAT W OR W/O PELVIS 2-3 VIEWS RIGHT   XR KNEE 3 VIEW LEFT   US  Guided Needle Placement - No Linked Charges   No orders of the defined types were placed in this encounter.     Procedures: No procedures performed   Clinical Data: No additional findings.  Objective: Vital Signs: There were no vitals taken for this visit.  Physical Exam:  Constitutional: Patient appears well-developed HEENT:  Head: Normocephalic Eyes:EOM are normal Neck: Normal range of motion Cardiovascular: Normal rate Pulmonary/chest: Effort normal Neurologic: Patient is alert Skin: Skin is warm Psychiatric: Patient has normal mood and affect  Ortho Exam: Ortho exam demonstrates that the leg is straight and flexes to about 115.  Collateral cruciate ligaments are stable.  No effusion.  Medial greater than lateral joint line tenderness.  No groin pain with internal/external Tatian of the leg.  Pedal pulses palpable.  Ankle dorsiflexion intact.  Right hip exam demonstrates no groin pain with internal/external rotation of the leg.  Has very good hip abduction strength with stable level pelvis when standing on alternating left and right foot.  Has very good hip abduction adduction and hip flexion strength.  Is tender to the right trochanteric region compared to the left which is nontender  Specialty Comments:  No specialty comments available.  Imaging: No results found.   PMFS History: There are no active problems to display for this patient.  Past Medical History:  Diagnosis Date   High cholesterol    History of kidney stones    Hypertension    Sciatica    Sleep apnea     No family history on file.  Past Surgical History:  Procedure Laterality Date   CYSTOSCOPY/URETEROSCOPY/HOLMIUM LASER/STENT PLACEMENT Right 08/22/2019   Procedure: CYSTOSCOPY/URETEROSCOPY/HOLMIUM LASER/STENT PLACEMENT;  Surgeon: Devere Lonni Righter, MD;  Location[facility]  Service:  Urology;  Laterality: Right;   TONSILLECTOMY  age 57   Social History   Occupational History   Not on file  Tobacco Use   Smoking status: Never   Smokeless tobacco: Never  Vaping Use   Vaping status: Never Used  Substance and Sexual Activity   Alcohol use: Yes    Comment: occ   Drug use: No   Sexual activity: Not on file

## 2024-02-02 NOTE — Telephone Encounter (Signed)
 VOB submitted for Durolane, left knee.

## 2024-02-10 ENCOUNTER — Ambulatory Visit: Admitting: Family Medicine

## 2024-02-15 ENCOUNTER — Ambulatory Visit: Admitting: Orthopedic Surgery

## 2024-02-15 ENCOUNTER — Other Ambulatory Visit: Payer: Self-pay

## 2024-02-15 DIAGNOSIS — M1712 Unilateral primary osteoarthritis, left knee: Secondary | ICD-10-CM

## 2024-02-15 DIAGNOSIS — M25562 Pain in left knee: Secondary | ICD-10-CM

## 2024-02-16 ENCOUNTER — Encounter: Payer: Self-pay | Admitting: Orthopedic Surgery

## 2024-02-16 MED ORDER — LIDOCAINE HCL 1 % IJ SOLN
5.0000 mL | INTRAMUSCULAR | Status: AC | PRN
  Administered 2024-02-15: 5 mL

## 2024-02-16 MED ORDER — SODIUM HYALURONATE 60 MG/3ML IX PRSY
60.0000 mg | PREFILLED_SYRINGE | INTRA_ARTICULAR | Status: AC | PRN
  Administered 2024-02-15: 60 mg via INTRA_ARTICULAR

## 2024-02-16 NOTE — Progress Notes (Signed)
   Procedure Note  Patient: Jeffrey Wilkerson             Date of Birth: 01-05-1956           MRN: [id number]             Visit Date: 02/15/2024  Procedures: Visit Diagnoses:  1. Arthritis of left knee     Large Joint Inj: L knee on 02/15/2024 9:13 PM Indications: diagnostic evaluation, joint swelling and pain Details: 18 G 1.5 in needle, superolateral approach  Arthrogram: No  Medications: 5 mL lidocaine  1 %; 60 mg Sodium Hyaluronate 60 MG/3ML Outcome: tolerated well, no immediate complications Procedure, treatment alternatives, risks and benefits explained, specific risks discussed. Consent was given by the patient. Immediately prior to procedure a time out was called to verify the correct patient, procedure, equipment, support staff and site/side marked as required. Patient was prepped and draped in the usual sterile fashion.

## 2024-02-17 ENCOUNTER — Ambulatory Visit (INDEPENDENT_AMBULATORY_CARE_PROVIDER_SITE_OTHER): Admitting: Family Medicine

## 2024-02-17 ENCOUNTER — Encounter: Payer: Self-pay | Admitting: Family Medicine

## 2024-02-17 VITALS — BP 138/72 | HR 62 | Temp 98.1°F | Ht 70.0 in | Wt 182.6 lb

## 2024-02-17 DIAGNOSIS — Z7689 Persons encountering health services in other specified circumstances: Secondary | ICD-10-CM

## 2024-02-17 DIAGNOSIS — I10 Essential (primary) hypertension: Secondary | ICD-10-CM | POA: Diagnosis not present

## 2024-02-17 DIAGNOSIS — Z23 Encounter for immunization: Secondary | ICD-10-CM

## 2024-02-17 DIAGNOSIS — Z125 Encounter for screening for malignant neoplasm of prostate: Secondary | ICD-10-CM

## 2024-02-17 DIAGNOSIS — R7989 Other specified abnormal findings of blood chemistry: Secondary | ICD-10-CM | POA: Diagnosis not present

## 2024-02-17 DIAGNOSIS — E559 Vitamin D deficiency, unspecified: Secondary | ICD-10-CM | POA: Diagnosis not present

## 2024-02-17 DIAGNOSIS — Z0001 Encounter for general adult medical examination with abnormal findings: Secondary | ICD-10-CM | POA: Diagnosis not present

## 2024-02-17 DIAGNOSIS — Z13220 Encounter for screening for lipoid disorders: Secondary | ICD-10-CM

## 2024-02-17 NOTE — Addendum Note (Signed)
 Addended by: ANGELENA RONAL BRADLEY K on: 02/17/2024 09:43 AM   Modules accepted: Orders

## 2024-02-17 NOTE — Progress Notes (Signed)
 Subjective:    Patient ID: Jeffrey Wilkerson, male    DOB: [birth year], 68 y.o.   MRN: [id number]  HPI Here today to establish care.  Patient is a very pleasant 68 year old Caucasian gentleman who has a history of hypertension, hyperlipidemia, and obstructive sleep apnea.  He states that his last colonoscopy was at age 74.  He is not due again for colonoscopy until 4 more years.  He has a history of low vitamin D  as well as low testosterone .  He is not currently on treatment.  He is due for prostate cancer screening.  He has had his shingles vaccine.  He is due for a pneumonia vaccine.  His tetanus shot is not due again until 2032.  He does have a strong family history of coronary artery disease in his father however his father smoked heavily.  Past Medical History:  Diagnosis Date   High cholesterol    History of kidney stones    Hypertension    Sciatica    Sleep apnea    Past Surgical History:  Procedure Laterality Date   CYSTOSCOPY/URETEROSCOPY/HOLMIUM LASER/STENT PLACEMENT Right 08/22/2019   Procedure: CYSTOSCOPY/URETEROSCOPY/HOLMIUM LASER/STENT PLACEMENT;  Surgeon: Devere Lonni Righter, MD;  Location: [facility];  Service: Urology;  Laterality: Right;   TONSILLECTOMY  age 68   Current Outpatient Medications on File Prior to Visit  Medication Sig Dispense Refill   aspirin EC 81 MG tablet Take 81 mg by mouth daily.     Atorvastatin Calcium (LIPITOR PO) Take by mouth.     Biotin 1 MG CAPS Take by mouth.     chlorhexidine (HIBICLENS ) 4 % external liquid Apply topically daily as needed. (Patient not taking: Reported on 07/28/2023) 120 mL 0   Cholecalciferol (VITAMIN D3) 1.25 MG (50000 UT) CAPS Take 1 capsule by mouth once a week. (Patient not taking: Reported on 07/28/2023)     clomiPHENE (CLOMID) 50 MG tablet Take 25 mg by mouth daily.     Collagen-Vitamin C-Biotin (COLLAGEN 1500/C PO) Take by mouth.     collagenase (SANTYL) 250 UNIT/GM ointment Apply 1 Application  topically daily.     Ergocalciferol  10 MCG (400 UNIT) TABS Take 1 tablet by mouth daily.     glucosamine-chondroitin 500-400 MG tablet Take 1 tablet by mouth 3 (three) times daily.     HYDROcodone -acetaminophen  (NORCO) 5-325 MG tablet Take 1 tablet by mouth every 4 (four) hours as needed for moderate pain. (Patient not taking: Reported on 07/28/2023) 20 tablet 0   lisinopril (ZESTRIL) 40 MG tablet Take 40 mg by mouth daily.     Magnesium Glycinate 100 MG CAPS Take by mouth.     Multiple Vitamin (MULTIVITAMIN) tablet Take 1 tablet by mouth daily.     oxybutynin  (DITROPAN ) 5 MG tablet Take 1 tablet (5 mg total) by mouth every 8 (eight) hours as needed for bladder spasms. (Patient not taking: Reported on 07/28/2023) 30 tablet 1   sulfamethoxazole -trimethoprim  (BACTRIM  DS) 800-160 MG tablet Take 1 tablet by mouth 2 (two) times daily. (Patient not taking: Reported on 07/28/2023) 20 tablet 0   Turmeric 400 MG CAPS Take by mouth.     vitamin B-12 (CYANOCOBALAMIN) 100 MCG tablet Take 100 mcg by mouth daily.     No current facility-administered medications on file prior to visit.   No Known Allergies Social History   Socioeconomic History   Marital status: Married    Spouse name: Not on file   Number of children: Not on file  Years of education: Not on file   Highest education level: Doctorate  Occupational History   Not on file  Tobacco Use   Smoking status: Never   Smokeless tobacco: Never  Vaping Use   Vaping status: Never Used  Substance and Sexual Activity   Alcohol use: Yes    Comment: occ   Drug use: No   Sexual activity: Not on file  Other Topics Concern   Not on file  Social History Narrative   Not on file   Social Drivers of Health   Financial Resource Strain: Low Risk  (02/15/2024)   Overall Financial Resource Strain (CARDIA)    Difficulty of Paying Living Expenses: Not hard at all  Food Insecurity: No Food Insecurity (02/15/2024)   Hunger Vital Sign    Worried About  Running Out of Food in the Last Year: Never true    Ran Out of Food in the Last Year: Never true  Transportation Needs: No Transportation Needs (02/15/2024)   PRAPARE - Administrator, Civil Service (Medical): No    Lack of Transportation (Non-Medical): No  Physical Activity: Sufficiently Active (02/15/2024)   Exercise Vital Sign    Days of Exercise per Week: 5 days    Minutes of Exercise per Session: 40 min  Stress: No Stress Concern Present (02/15/2024)   [employer] of Occupational Health - Occupational Stress Questionnaire    Feeling of Stress: Not at all  Social Connections: Socially Integrated (02/15/2024)   Social Connection and Isolation Panel    Frequency of Communication with Friends and Family: More than three times a week    Frequency of Social Gatherings with Friends and Family: More than three times a week    Attends Religious Services: More than 4 times per year    Active Member of [employer] or Organizations: Yes    Attends Banker Meetings: Not on file    Marital Status: Married  Catering manager Violence: Not on file   No family history on file.     Review of Systems  All other systems reviewed and are negative.      Objective:   Physical Exam Vitals reviewed.  Constitutional:      General: He is not in acute distress.    Appearance: Normal appearance. He is normal weight. He is not ill-appearing, toxic-appearing or diaphoretic.  HENT:     Head: Normocephalic and atraumatic.     Right Ear: Tympanic membrane normal.     Left Ear: Tympanic membrane normal.     Nose: Nose normal.     Mouth/Throat:     Mouth: Mucous membranes are moist.     Pharynx: Oropharynx is clear.  Eyes:     Extraocular Movements: Extraocular movements intact.     Pupils: Pupils are equal, round, and reactive to light.  Neck:     Vascular: No carotid bruit.  Cardiovascular:     Rate and Rhythm: Normal rate and regular rhythm.     Pulses: Normal pulses.      Heart sounds: Normal heart sounds. No murmur heard.    No friction rub. No gallop.  Pulmonary:     Effort: Pulmonary effort is normal. No respiratory distress.     Breath sounds: Normal breath sounds. No wheezing, rhonchi or rales.  Abdominal:     General: Abdomen is flat. There is no distension.     Palpations: Abdomen is soft.     Tenderness: There is no abdominal tenderness. There is  no guarding or rebound.  Musculoskeletal:     Cervical back: Normal range of motion and neck supple.     Right lower leg: No edema.     Left lower leg: No edema.  Skin:    Findings: No erythema.  Neurological:     General: No focal deficit present.     Mental Status: He is alert and oriented to person, place, and time. Mental status is at baseline.  Psychiatric:        Mood and Affect: Mood normal.        Behavior: Behavior normal.        Thought Content: Thought content normal.        Judgment: Judgment normal.          Assessment & Plan:  Screening cholesterol level - Plan: Lipid panel, CBC with Differential/Platelet, Comprehensive metabolic panel with GFR  Prostate cancer screening - Plan: PSA  Establishing care with new doctor, encounter for  Low testosterone  - Plan: Testosterone  Total,Free,Bio, Males  Vitamin D  deficiency - Plan: VITAMIN D  25 Hydroxy (Vit-D Deficiency, Fractures)  Benign essential HTN - Plan: CT CARDIAC SCORING (SELF PAY ONLY) Physical exam today is normal.  Patient received Prevnar 20.  The remainder of his immunizations are up-to-date.  He will get his flu shot in the fall.  I will check for prostate cancer with a PSA.  I will check a CBC, CMP, and a fasting lipid panel.  Patient would like to get a coronary artery calcium score to evaluate for the presence of plaque in his coronary arteries and to risk stratify himself.  I will also check a testosterone  level and a vitamin D  level.  Patient is interested in low testosterone  treatment due to fatigue and muscle loss.   He has lost 18 pounds over the last year but he attributes this to a healthier diet.

## 2024-02-21 ENCOUNTER — Other Ambulatory Visit

## 2024-02-21 DIAGNOSIS — E559 Vitamin D deficiency, unspecified: Secondary | ICD-10-CM | POA: Diagnosis not present

## 2024-02-21 DIAGNOSIS — R7989 Other specified abnormal findings of blood chemistry: Secondary | ICD-10-CM | POA: Diagnosis not present

## 2024-02-21 DIAGNOSIS — Z13220 Encounter for screening for lipoid disorders: Secondary | ICD-10-CM | POA: Diagnosis not present

## 2024-02-22 LAB — CBC WITH DIFFERENTIAL/PLATELET
Absolute Lymphocytes: 1418 {cells}/uL (ref 850–3900)
Absolute Monocytes: 901 {cells}/uL (ref 200–950)
Basophils Absolute: 38 {cells}/uL (ref 0–200)
Basophils Relative: 0.6 %
Eosinophils Absolute: 170 {cells}/uL (ref 15–500)
Eosinophils Relative: 2.7 %
HCT: 43.3 % (ref 38.5–50.0)
Hemoglobin: 13.7 g/dL (ref 13.2–17.1)
MCH: 27.5 pg (ref 27.0–33.0)
MCHC: 31.6 g/dL — ABNORMAL LOW (ref 32.0–36.0)
MCV: 86.9 fL (ref 80.0–100.0)
MPV: 9.8 fL (ref 7.5–12.5)
Monocytes Relative: 14.3 %
Neutro Abs: 3774 {cells}/uL (ref 1500–7800)
Neutrophils Relative %: 59.9 %
Platelets: 194 Thousand/uL (ref 140–400)
RBC: 4.98 Million/uL (ref 4.20–5.80)
RDW: 13.8 % (ref 11.0–15.0)
Total Lymphocyte: 22.5 %
WBC: 6.3 Thousand/uL (ref 3.8–10.8)

## 2024-02-22 LAB — COMPREHENSIVE METABOLIC PANEL WITH GFR
AG Ratio: 2 (calc) (ref 1.0–2.5)
ALT: 18 U/L (ref 9–46)
AST: 15 U/L (ref 10–35)
Albumin: 4.1 g/dL (ref 3.6–5.1)
Alkaline phosphatase (APISO): 44 U/L (ref 35–144)
BUN: 17 mg/dL (ref 7–25)
CO2: 27 mmol/L (ref 20–32)
Calcium: 9 mg/dL (ref 8.6–10.3)
Chloride: 106 mmol/L (ref 98–110)
Creat: 1.01 mg/dL (ref 0.70–1.35)
Globulin: 2.1 g/dL (ref 1.9–3.7)
Glucose, Bld: 89 mg/dL (ref 65–99)
Potassium: 4.4 mmol/L (ref 3.5–5.3)
Sodium: 139 mmol/L (ref 135–146)
Total Bilirubin: 0.4 mg/dL (ref 0.2–1.2)
Total Protein: 6.2 g/dL (ref 6.1–8.1)
eGFR: 82 mL/min/1.73m2 (ref >=60)

## 2024-02-22 LAB — TESTOSTERONE TOTAL,FREE,BIO, MALES
Sex Hormone Binding: 32 nmol/L (ref 22–77)
Sex Hormone Binding: 32 nmol/L (ref 3.6–77)
Testosterone, Bioavailable: 88.7 ng/dL — ABNORMAL LOW (ref 110.0–575.0)
Testosterone, Free: 47.1 pg/mL (ref 46.0–224.0)
Testosterone: 348 ng/dL (ref 250–827)

## 2024-02-22 LAB — LIPID PANEL
Cholesterol: 192 mg/dL (ref <200)
HDL: 57 mg/dL (ref >=40)
LDL Cholesterol (Calc): 109 mg/dL — ABNORMAL HIGH
Non-HDL Cholesterol (Calc): 135 mg/dL — ABNORMAL HIGH (ref <130)
Total CHOL/HDL Ratio: 3.4 (calc) (ref <5.0)
Triglycerides: 149 mg/dL (ref <150)

## 2024-02-22 LAB — VITAMIN D 25 HYDROXY (VIT D DEFICIENCY, FRACTURES): Vit D, 25-Hydroxy: 39 ng/mL (ref 30–100)

## 2024-02-22 LAB — PSA: PSA: 0.24 ng/mL (ref <=4.00)

## 2024-02-23 ENCOUNTER — Ambulatory Visit: Payer: Self-pay | Admitting: Family Medicine

## 2024-03-06 ENCOUNTER — Ambulatory Visit (HOSPITAL_BASED_OUTPATIENT_CLINIC_OR_DEPARTMENT_OTHER)
Admission: RE | Admit: 2024-03-06 | Discharge: 2024-03-06 | Disposition: A | Discharge status: Home / Self Care | Payer: Self-pay | Source: Ambulatory Visit | Attending: Family Medicine | Admitting: Family Medicine

## 2024-03-06 DIAGNOSIS — I10 Essential (primary) hypertension: Secondary | ICD-10-CM | POA: Insufficient documentation

## 2024-03-08 ENCOUNTER — Other Ambulatory Visit: Payer: Self-pay | Admitting: Family Medicine

## 2024-03-08 MED ORDER — ATORVASTATIN CALCIUM 10 MG PO TABS: 10.0000 mg | ORAL_TABLET | Freq: Every day | ORAL | 3 refills | Status: AC

## 2024-03-08 MED ORDER — LISINOPRIL 40 MG PO TABS: 40.0000 mg | ORAL_TABLET | Freq: Every day | ORAL | 3 refills | Status: AC

## 2024-04-25 ENCOUNTER — Encounter: Payer: Self-pay | Admitting: Orthopedic Surgery

## 2024-04-25 ENCOUNTER — Ambulatory Visit: Admitting: Orthopedic Surgery

## 2024-04-25 DIAGNOSIS — M1712 Unilateral primary osteoarthritis, left knee: Secondary | ICD-10-CM | POA: Diagnosis not present

## 2024-04-25 NOTE — Progress Notes (Unsigned)
 Office Visit Note   Patient: Jeffrey Wilkerson           Date of Birth: August 30, 1955           MRN: [id number] Visit Date: 04/25/2024 Requested by: Duanne Butler DASEN, MD [address] [place],  [place] [address] PCP: Duanne Butler DASEN, MD  Subjective: Chief Complaint  Patient presents with   Left Knee - Pain    HPI: Jeffrey Wilkerson is a 68 y.o. male who presents to the office reporting left knee pain.  Patient had Durling injection on 02/15/2024 which gave him 1 week of relief.  He is ambulating with a limp.  Using a compression sleeve.  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  The right hip bursitis shot did help him some.  Hard for him to walk without pain.  He actually does at x 10,000 steps a day.  He is otherwise healthy.  Losing about 15 pounds did help his knee pain to a small degree..                ROS: All systems reviewed are negative as they relate to the chief complaint within the history of present illness.  Patient denies fevers or chills.  Assessment & Plan: Visit Diagnoses:  1. Arthritis of left knee     Plan: Impression is end-stage arthritis in that left knee.  Failure of conservative management.  His current options are to live with what he has and manage as best he can with episodic injections as well as over-the-counter medication.  Knee replacement is also an option.  The risk and benefits of that are discussed including but limited to infection or vessel damage knee stiffness incomplete pain weak as well as incomplete restoration of function.  The extensive nature of the rehabilitative process is also discussed.  He is going to consider his options and he may schedule surgery for sometime in the future.  Follow-Up Instructions: No follow-ups on file.   Orders:  No orders of the defined types were placed in this encounter.  No orders of the defined types were placed in this encounter.     Procedures: No procedures  performed   Clinical Data: No additional findings.  Objective: Vital Signs: There were no vitals taken for this visit.  Physical Exam:  Constitutional: Patient appears well-developed HEENT:  Head: Normocephalic Eyes:EOM are normal Neck: Normal range of motion Cardiovascular: Normal rate Pulmonary/chest: Effort normal Neurologic: Patient is alert Skin: Skin is warm Psychiatric: Patient has normal mood and affect  Ortho Exam: Ortho exam demonstrates pretty normal gait alignment with excellent quad tone bilaterally.  Has nearly full range of motion on the left in terms of extension and flexion.  Moderate effusion is present.  Has medial greater than lateral joint line tenderness but patellofemoral crepitus is present bilaterally.  Specialty Comments:  No specialty comments available.  Imaging: No results found.   PMFS History: Patient Active Problem List   Diagnosis Date Noted   Chronic pain of left knee 02/08/2017   Past Medical History:  Diagnosis Date   High cholesterol    History of kidney stones    Hypertension    Sciatica    Sleep apnea     No family history on file.  Past Surgical History:  Procedure Laterality Date   CYSTOSCOPY/URETEROSCOPY/HOLMIUM LASER/STENT PLACEMENT Right 08/22/2019   Procedure: CYSTOSCOPY/URETEROSCOPY/HOLMIUM LASER/STENT PLACEMENT;  Surgeon: Devere Lonni Righter, MD;  Location: [facility];  Service: Urology;  Laterality: Right;   TONSILLECTOMY  age 59   Social History   Occupational History   Not on file  Tobacco Use   Smoking status: Never   Smokeless tobacco: Never  Vaping Use   Vaping status: Never Used  Substance and Sexual Activity   Alcohol use: Yes    Comment: occ   Drug use: No   Sexual activity: Not on file

## 2024-05-02 ENCOUNTER — Telehealth: Payer: Self-pay | Admitting: Orthopedic Surgery

## 2024-05-02 NOTE — Telephone Encounter (Signed)
 Patient called and wants to know if can get a digital copy of his xray of the L knee. CB#[phone]

## 2024-05-03 NOTE — Telephone Encounter (Signed)
 Patient aware CD is ready for pick up at front desk

## 2024-05-07 ENCOUNTER — Encounter: Payer: Self-pay | Admitting: Radiology

## 2024-05-11 ENCOUNTER — Ambulatory Visit: Admitting: Family Medicine

## 2024-05-16 ENCOUNTER — Telehealth: Payer: Self-pay | Admitting: Family Medicine

## 2024-05-16 NOTE — Telephone Encounter (Signed)
 Copied from CRM [id number]. Topic: General - Other >> May 16, 2024  1:12 PM Avram MATSU wrote: Reason for CRM: patient stated [facility] office did not received his surgical clearance form, I informed it was faxed out on the 7th

## 2024-05-18 NOTE — Telephone Encounter (Signed)
 Form successfully faxed to [phone] with a confirmation time stamp of Nov/14/2025 3:30:09 PM. Spoke with patient to advise; stated he will come pick up his copy.

## 2024-05-18 NOTE — Telephone Encounter (Signed)
 Copied from CRM #[id number]. Topic: General - Other >> May 18, 2024 10:23 AM Delon DASEN wrote: Reason for CRM: Luke with [facility], office waiting for the notes from the clearance appt, if not cleared, they faxed over a form to be filled out and sent back- [phone] and fax [phone] \\\\\\\ Patient came to the office to follow up on this; stated he's in a lot of pain and needs knee surgery. Requesting call back with status update. Please advise at [phone].

## 2024-05-18 NOTE — Telephone Encounter (Signed)
 Patient stated he will have the form refaxed; requesting a copy for his records once completed and signed.   Please advise patient when form completed and ready for pickup at [phone].

## 2024-06-06 ENCOUNTER — Telehealth: Payer: Self-pay

## 2024-06-06 NOTE — Telephone Encounter (Signed)
 Holding on surgery scheduling due to patient currently seeking treatment elsewhere for current knee arthritis.

## 2024-07-13 ENCOUNTER — Encounter: Payer: Self-pay | Admitting: Family Medicine

## 2024-07-13 ENCOUNTER — Ambulatory Visit: Admitting: Family Medicine

## 2024-07-13 VITALS — BP 122/70 | HR 63 | Temp 98.5°F | Ht 70.0 in | Wt 184.0 lb

## 2024-07-13 DIAGNOSIS — G4733 Obstructive sleep apnea (adult) (pediatric): Secondary | ICD-10-CM

## 2024-07-13 NOTE — Progress Notes (Signed)
 "  Subjective:    Patient ID: Jeffrey Wilkerson, male    DOB: [birth year], 69 y.o.   MRN: [id number]  HPI Patient has a remote history of sleep apnea.  He was diagnosed with a sleep study in 2006 and another health system.  This was prior to moving to [place] .  He has been wearing his CPAP machine faithfully every night for the last 19 years.  He wears it for least 7 hours every night.  He benefits dramatically from wearing his CPAP.  Without the machine he feels extremely tired and lethargic the next day.  He still snores extremely loudly without the machine.  However his current machine is starting to malfunction.  It is reached the end of its effective motor life.  He is requesting a prescription for a new CPAP machine Past Medical History:  Diagnosis Date   High cholesterol    History of kidney stones    Hypertension    Sciatica    Sleep apnea    Past Surgical History:  Procedure Laterality Date   CYSTOSCOPY/URETEROSCOPY/HOLMIUM LASER/STENT PLACEMENT Right 08/22/2019   Procedure: CYSTOSCOPY/URETEROSCOPY/HOLMIUM LASER/STENT PLACEMENT;  Surgeon: Devere Lonni Righter, MD;  Location: [facility];  Service: Urology;  Laterality: Right;   TONSILLECTOMY  age 11   Current Outpatient Medications on File Prior to Visit  Medication Sig Dispense Refill   aspirin EC 81 MG tablet Take 81 mg by mouth daily.     atorvastatin  (LIPITOR) 10 MG tablet Take 1 tablet (10 mg total) by mouth daily. 90 tablet 3   Biotin 1 MG CAPS Take by mouth.     Cholecalciferol (VITAMIN D3) 1.25 MG (50000 UT) CAPS Take 1 capsule by mouth once a week.     Collagen-Vitamin C-Biotin (COLLAGEN 1500/C PO) Take by mouth.     glucosamine-chondroitin 500-400 MG tablet Take 1 tablet by mouth 3 (three) times daily.     lisinopril  (ZESTRIL ) 40 MG tablet Take 1 tablet (40 mg total) by mouth daily. 90 tablet 3   Magnesium Glycinate 100 MG CAPS Take by mouth.     Multiple Vitamin (MULTIVITAMIN) tablet Take 1  tablet by mouth daily.     Turmeric 400 MG CAPS Take by mouth.     vitamin B-12 (CYANOCOBALAMIN) 100 MCG tablet Take 100 mcg by mouth daily.     chlorhexidine (HIBICLENS ) 4 % external liquid Apply topically daily as needed. (Patient not taking: Reported on 07/13/2024) 120 mL 0   clomiPHENE (CLOMID) 50 MG tablet Take 25 mg by mouth daily. (Patient not taking: Reported on 07/13/2024)     collagenase (SANTYL) 250 UNIT/GM ointment Apply 1 Application topically daily. (Patient not taking: Reported on 07/13/2024)     Ergocalciferol  10 MCG (400 UNIT) TABS Take 1 tablet by mouth daily. (Patient not taking: Reported on 07/13/2024)     HYDROcodone -acetaminophen  (NORCO) 5-325 MG tablet Take 1 tablet by mouth every 4 (four) hours as needed for moderate pain. (Patient not taking: Reported on 07/13/2024) 20 tablet 0   oxybutynin  (DITROPAN ) 5 MG tablet Take 1 tablet (5 mg total) by mouth every 8 (eight) hours as needed for bladder spasms. (Patient not taking: Reported on 07/13/2024) 30 tablet 1   sulfamethoxazole -trimethoprim  (BACTRIM  DS) 800-160 MG tablet Take 1 tablet by mouth 2 (two) times daily. (Patient not taking: Reported on 07/13/2024) 20 tablet 0   No current facility-administered medications on file prior to visit.   No Known Allergies Social History   Socioeconomic History  Marital status: Married    Spouse name: Not on file   Number of children: Not on file   Years of education: Not on file   Highest education level: Doctorate  Occupational History   Not on file  Tobacco Use   Smoking status: Never   Smokeless tobacco: Never  Vaping Use   Vaping status: Never Used  Substance and Sexual Activity   Alcohol use: Yes    Comment: occ   Drug use: No   Sexual activity: Not on file  Other Topics Concern   Not on file  Social History Narrative   Not on file   Social Drivers of Health   Tobacco Use: Low Risk (07/13/2024)   Patient History    Smoking Tobacco Use: Never    Smokeless Tobacco Use: Never     Passive Exposure: Not on file  Financial Resource Strain: Low Risk (02/15/2024)   Overall Financial Resource Strain (CARDIA)    Difficulty of Paying Living Expenses: Not hard at all  Food Insecurity: No Food Insecurity (02/15/2024)   Epic    Worried About Radiation Protection Practitioner of Food in the Last Year: Never true    Ran Out of Food in the Last Year: Never true  Transportation Needs: No Transportation Needs (02/15/2024)   Epic    Lack of Transportation (Medical): No    Lack of Transportation (Non-Medical): No  Physical Activity: Sufficiently Active (02/15/2024)   Exercise Vital Sign    Days of Exercise per Week: 5 days    Minutes of Exercise per Session: 40 min  Stress: No Stress Concern Present (02/15/2024)   [employer] of Occupational Health - Occupational Stress Questionnaire    Feeling of Stress: Not at all  Social Connections: Socially Integrated (02/15/2024)   Social Connection and Isolation Panel    Frequency of Communication with Friends and Family: More than three times a week    Frequency of Social Gatherings with Friends and Family: More than three times a week    Attends Religious Services: More than 4 times per year    Active Member of [employer] or Organizations: Yes    Attends Banker Meetings: Not on file    Marital Status: Married  Intimate Partner Violence: Not on file  Depression (PHQ2-9): Low Risk (02/17/2024)   Depression (PHQ2-9)    PHQ-2 Score: 0  Alcohol Screen: Low Risk (02/15/2024)   Alcohol Screen    Last Alcohol Screening Score (AUDIT): 4  Housing: Low Risk (02/15/2024)   Epic    Unable to Pay for Housing in the Last Year: No    Number of Times Moved in the Last Year: 0    Homeless in the Last Year: No  Utilities: Not on file  Health Literacy: Not on file   No family history on file.     Review of Systems  All other systems reviewed and are negative.      Objective:   Physical Exam Vitals reviewed.  Constitutional:      General: He is  not in acute distress.    Appearance: Normal appearance. He is normal weight. He is not ill-appearing, toxic-appearing or diaphoretic.  HENT:     Head: Normocephalic and atraumatic.     Right Ear: Tympanic membrane normal.     Left Ear: Tympanic membrane normal.     Nose: Nose normal.     Mouth/Throat:     Mouth: Mucous membranes are moist.     Pharynx: Oropharynx is clear.  Eyes:     Extraocular Movements: Extraocular movements intact.     Pupils: Pupils are equal, round, and reactive to light.  Neck:     Vascular: No carotid bruit.  Cardiovascular:     Rate and Rhythm: Normal rate and regular rhythm.     Pulses: Normal pulses.     Heart sounds: Normal heart sounds. No murmur heard.    No friction rub. No gallop.  Pulmonary:     Effort: Pulmonary effort is normal. No respiratory distress.     Breath sounds: Normal breath sounds. No wheezing, rhonchi or rales.  Abdominal:     General: Abdomen is flat. There is no distension.     Palpations: Abdomen is soft.     Tenderness: There is no abdominal tenderness. There is no guarding or rebound.  Musculoskeletal:     Cervical back: Normal range of motion and neck supple.     Right lower leg: No edema.     Left lower leg: No edema.  Skin:    Findings: No erythema.  Neurological:     General: No focal deficit present.     Mental Status: He is alert and oriented to person, place, and time. Mental status is at baseline.  Psychiatric:        Mood and Affect: Mood normal.        Behavior: Behavior normal.        Thought Content: Thought content normal.        Judgment: Judgment normal.           Assessment & Plan:  OSA (obstructive sleep apnea) - Plan: For home use only DME continuous positive airway pressure (CPAP)  I will send a prescription for a new CPAP machine with auto titration and heated humidified air starting at a minimum of 5 cm of water and increasing to a maximum of 15 cm of water.  Unfortunately I do not have  records of his previous sleep study that was [place] almost 20 [place] "

## 2024-07-25 ENCOUNTER — Telehealth: Payer: Self-pay

## 2024-07-25 NOTE — Telephone Encounter (Signed)
 Copied from CRM #[id number]. Topic: Clinical - Prescription Issue >> Jul 25, 2024  3:48 PM Rosaria BRAVO wrote: Reason for CRM: Pt would like an update on his CPAP machine order, is concerned that he has not heard from the supplier yet. Please advise, he wants to the contact information for the supplier sent to his mychart. Please advise.
